# Patient Record
Sex: Female | Born: 1935 | Race: White | Hispanic: No | Smoking: Never smoker
Health system: Southern US, Community
[De-identification: ages and names within clinical notes are randomized; demographics above are authoritative.]

## PROBLEM LIST (undated history)

## (undated) DIAGNOSIS — M419 Scoliosis, unspecified: Secondary | ICD-10-CM

## (undated) DIAGNOSIS — G609 Hereditary and idiopathic neuropathy, unspecified: Secondary | ICD-10-CM

## (undated) DIAGNOSIS — E7800 Pure hypercholesterolemia, unspecified: Secondary | ICD-10-CM

## (undated) DIAGNOSIS — M810 Age-related osteoporosis without current pathological fracture: Secondary | ICD-10-CM

## (undated) DIAGNOSIS — D126 Benign neoplasm of colon, unspecified: Secondary | ICD-10-CM

## (undated) DIAGNOSIS — M159 Polyosteoarthritis, unspecified: Secondary | ICD-10-CM

## (undated) DIAGNOSIS — I10 Essential (primary) hypertension: Secondary | ICD-10-CM

## (undated) DIAGNOSIS — M1990 Unspecified osteoarthritis, unspecified site: Secondary | ICD-10-CM

## (undated) DIAGNOSIS — D509 Iron deficiency anemia, unspecified: Secondary | ICD-10-CM

## (undated) DIAGNOSIS — IMO0002 Reserved for concepts with insufficient information to code with codable children: Secondary | ICD-10-CM

## (undated) DIAGNOSIS — K219 Gastro-esophageal reflux disease without esophagitis: Secondary | ICD-10-CM

## (undated) DIAGNOSIS — I499 Cardiac arrhythmia, unspecified: Secondary | ICD-10-CM

## (undated) DIAGNOSIS — F411 Generalized anxiety disorder: Secondary | ICD-10-CM

## (undated) DIAGNOSIS — C8590 Non-Hodgkin lymphoma, unspecified, unspecified site: Secondary | ICD-10-CM

## (undated) DIAGNOSIS — M21372 Foot drop, left foot: Secondary | ICD-10-CM

## (undated) DIAGNOSIS — R430 Anosmia: Secondary | ICD-10-CM

## (undated) DIAGNOSIS — E039 Hypothyroidism, unspecified: Secondary | ICD-10-CM

## (undated) DIAGNOSIS — Z9049 Acquired absence of other specified parts of digestive tract: Secondary | ICD-10-CM

## (undated) DIAGNOSIS — E079 Disorder of thyroid, unspecified: Secondary | ICD-10-CM

## (undated) DIAGNOSIS — M21371 Foot drop, right foot: Secondary | ICD-10-CM

## (undated) DIAGNOSIS — K30 Functional dyspepsia: Secondary | ICD-10-CM

## (undated) DIAGNOSIS — D630 Anemia in neoplastic disease: Secondary | ICD-10-CM

## (undated) DIAGNOSIS — C801 Malignant (primary) neoplasm, unspecified: Secondary | ICD-10-CM

## (undated) DIAGNOSIS — G629 Polyneuropathy, unspecified: Secondary | ICD-10-CM

## (undated) DIAGNOSIS — M5137 Other intervertebral disc degeneration, lumbosacral region: Secondary | ICD-10-CM

## (undated) HISTORY — DX: Foot drop, right foot: M21.371

## (undated) HISTORY — DX: Anemia in neoplastic disease: D63.0

## (undated) HISTORY — DX: Non-Hodgkin lymphoma, unspecified, unspecified site: C85.90

## (undated) HISTORY — DX: Anosmia: R43.0

## (undated) HISTORY — DX: Acquired absence of other specified parts of digestive tract: Z90.49

## (undated) HISTORY — DX: Benign neoplasm of colon, unspecified: D12.6

## (undated) HISTORY — DX: Scoliosis, unspecified: M41.9

## (undated) HISTORY — DX: Reserved for concepts with insufficient information to code with codable children: IMO0002

## (undated) HISTORY — DX: Hypothyroidism, unspecified: E03.9

## (undated) HISTORY — DX: Pure hypercholesterolemia, unspecified: E78.00

## (undated) HISTORY — DX: Functional dyspepsia: K30

## (undated) HISTORY — DX: Generalized anxiety disorder: F41.1

## (undated) HISTORY — DX: Iron deficiency anemia, unspecified: D50.9

## (undated) HISTORY — DX: Hereditary and idiopathic neuropathy, unspecified: G60.9

## (undated) HISTORY — DX: Cardiac arrhythmia, unspecified: I49.9

## (undated) HISTORY — DX: Polyosteoarthritis, unspecified: M15.9

## (undated) HISTORY — DX: Foot drop, left foot: M21.372

## (undated) HISTORY — DX: Polyneuropathy, unspecified: G62.9

## (undated) HISTORY — DX: Other intervertebral disc degeneration, lumbosacral region: M51.37

## (undated) HISTORY — PX: ABDOMINAL HYSTERECTOMY: SHX81

## (undated) HISTORY — DX: Age-related osteoporosis without current pathological fracture: M81.0

---
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
## 1991-05-21 HISTORY — PX: PARTIAL COLECTOMY: SHX5273

## 1992-05-20 HISTORY — PX: HEMORRHOID SURGERY: SHX153

## 1998-05-20 HISTORY — PX: CATARACT EXTRACTION W/ INTRAOCULAR LENS IMPLANT: SHX1309

## 2011-11-28 DIAGNOSIS — G609 Hereditary and idiopathic neuropathy, unspecified: Secondary | ICD-10-CM

## 2011-11-28 HISTORY — DX: Hereditary and idiopathic neuropathy, unspecified: G60.9

## 2012-10-24 DIAGNOSIS — I499 Cardiac arrhythmia, unspecified: Secondary | ICD-10-CM

## 2012-10-24 DIAGNOSIS — I10 Essential (primary) hypertension: Secondary | ICD-10-CM | POA: Insufficient documentation

## 2012-10-24 DIAGNOSIS — M5137 Other intervertebral disc degeneration, lumbosacral region: Secondary | ICD-10-CM

## 2012-10-24 DIAGNOSIS — M51379 Other intervertebral disc degeneration, lumbosacral region without mention of lumbar back pain or lower extremity pain: Secondary | ICD-10-CM

## 2012-10-24 DIAGNOSIS — IMO0002 Reserved for concepts with insufficient information to code with codable children: Secondary | ICD-10-CM

## 2012-10-24 HISTORY — DX: Cardiac arrhythmia, unspecified: I49.9

## 2012-10-24 HISTORY — DX: Other intervertebral disc degeneration, lumbosacral region: M51.37

## 2012-10-24 HISTORY — DX: Other intervertebral disc degeneration, lumbosacral region without mention of lumbar back pain or lower extremity pain: M51.379

## 2012-10-24 HISTORY — DX: Reserved for concepts with insufficient information to code with codable children: IMO0002

## 2015-07-01 ENCOUNTER — Emergency Department (HOSPITAL_BASED_OUTPATIENT_CLINIC_OR_DEPARTMENT_OTHER)
Admission: EM | Admit: 2015-07-01 | Discharge: 2015-07-01 | Disposition: A | Discharge status: Home / Self Care | Payer: Medicare Other | Attending: Emergency Medicine | Admitting: Emergency Medicine

## 2015-07-01 ENCOUNTER — Encounter (HOSPITAL_BASED_OUTPATIENT_CLINIC_OR_DEPARTMENT_OTHER): Payer: Self-pay | Admitting: Emergency Medicine

## 2015-07-01 DIAGNOSIS — E7800 Pure hypercholesterolemia, unspecified: Secondary | ICD-10-CM | POA: Insufficient documentation

## 2015-07-01 DIAGNOSIS — Z7982 Long term (current) use of aspirin: Secondary | ICD-10-CM | POA: Diagnosis not present

## 2015-07-01 DIAGNOSIS — K219 Gastro-esophageal reflux disease without esophagitis: Secondary | ICD-10-CM | POA: Insufficient documentation

## 2015-07-01 DIAGNOSIS — M1990 Unspecified osteoarthritis, unspecified site: Secondary | ICD-10-CM | POA: Diagnosis not present

## 2015-07-01 DIAGNOSIS — Z79899 Other long term (current) drug therapy: Secondary | ICD-10-CM | POA: Diagnosis not present

## 2015-07-01 DIAGNOSIS — R350 Frequency of micturition: Secondary | ICD-10-CM | POA: Diagnosis present

## 2015-07-01 DIAGNOSIS — E079 Disorder of thyroid, unspecified: Secondary | ICD-10-CM | POA: Insufficient documentation

## 2015-07-01 DIAGNOSIS — Z859 Personal history of malignant neoplasm, unspecified: Secondary | ICD-10-CM | POA: Insufficient documentation

## 2015-07-01 DIAGNOSIS — N390 Urinary tract infection, site not specified: Secondary | ICD-10-CM

## 2015-07-01 HISTORY — DX: Disorder of thyroid, unspecified: E07.9

## 2015-07-01 HISTORY — DX: Pure hypercholesterolemia, unspecified: E78.00

## 2015-07-01 HISTORY — DX: Malignant (primary) neoplasm, unspecified: C80.1

## 2015-07-01 HISTORY — DX: Unspecified osteoarthritis, unspecified site: M19.90

## 2015-07-01 HISTORY — DX: Gastro-esophageal reflux disease without esophagitis: K21.9

## 2015-07-01 LAB — URINE MICROSCOPIC-ADD ON

## 2015-07-01 LAB — URINALYSIS, ROUTINE W REFLEX MICROSCOPIC
GLUCOSE, UA: NEGATIVE mg/dL
Ketones, ur: 15 mg/dL — AB
Nitrite: POSITIVE — AB
PH: 6 (ref 5.0–8.0)
PROTEIN: 100 mg/dL — AB
Specific Gravity, Urine: 1.02 (ref 1.005–1.030)

## 2015-07-01 MED ORDER — PHENAZOPYRIDINE HCL 100 MG PO TABS
200.0000 mg | ORAL_TABLET | Freq: Once | ORAL | Status: AC
  Administered 2015-07-01: 200 mg via ORAL
  Filled 2015-07-01: qty 2

## 2015-07-01 MED ORDER — CEPHALEXIN 250 MG PO CAPS
500.0000 mg | ORAL_CAPSULE | Freq: Once | ORAL | Status: AC
  Administered 2015-07-01: 500 mg via ORAL
  Filled 2015-07-01: qty 2

## 2015-07-01 MED ORDER — ALIGN PO CAPS: 1.0000 | ORAL_CAPSULE | Freq: Three times a day (TID) | ORAL | Status: DC

## 2015-07-01 MED ORDER — CEPHALEXIN 500 MG PO CAPS: 500.0000 mg | ORAL_CAPSULE | Freq: Four times a day (QID) | ORAL | Status: DC

## 2015-07-01 NOTE — ED Notes (Signed)
Patient has had urinary frequency and lower pelvic pain. Patient feels like she has a UTI

## 2015-07-01 NOTE — ED Provider Notes (Addendum)
CSN: [id number]     Arrival date & time 07/01/15  2050 History  By signing my name below, I, Judith Clarke, attest that this documentation has been prepared under the direction and in the presence of Hong Moring, MD. Electronically Signed: Helane Clarke, ED Scribe. 07/01/2015. 11:17 PM.      Chief Complaint  Patient presents with  . Urinary Frequency   Patient is a 82 y.o. female presenting with frequency. The history is provided by the patient. No language interpreter was used.  Urinary Frequency This is a new problem. The current episode started more than 2 days ago. The problem has been gradually worsening. Pertinent negatives include no abdominal pain. Nothing aggravates the symptoms. Nothing relieves the symptoms. She has tried nothing for the symptoms. The treatment provided no relief.   HPI Comments: Judith Clarke is a 82 y.o. female who presents to the Emergency Department complaining of urinary frequency onset 3 days ago. Pt feels as though she is having a UTI. She reports associated dysuria and lower pelvic pain. She has not seen her PCP for this. Pt denies fever, n/v/d, constipation, and abdominal pain.   Past Medical History  Diagnosis Date  . Cancer ([place])   . High cholesterol   . Thyroid disease   . GERD (gastroesophageal reflux disease)   . Arthritis    Past Surgical History  Procedure Laterality Date  . Abdominal hysterectomy    . Eye surgery    . Colon surgery     History reviewed. No pertinent family history. Social History  Substance Use Topics  . Smoking status: Never Smoker   . Smokeless tobacco: None  . Alcohol Use: No   OB History    No data available     Review of Systems  Constitutional: Negative for fever.  Gastrointestinal: Negative for nausea, vomiting, abdominal pain, diarrhea and constipation.  Genitourinary: Positive for dysuria, frequency and pelvic pain.  All other systems reviewed and are negative.   Allergies  Review of patient's  allergies indicates no known allergies.  Home Medications   Prior to Admission medications   Medication Sig Start Date End Date Taking? Authorizing Provider  Ascorbic Acid (VITAMIN C) 1000 MG tablet Take 1,000 mg by mouth daily.   Yes Historical Provider, MD  aspirin EC 81 MG tablet Take 81 mg by mouth daily.   Yes Historical Provider, MD  gabapentin (NEURONTIN) 600 MG tablet Take 600 mg by mouth 3 (three) times daily.   Yes Historical Provider, MD  levothyroxine (SYNTHROID, LEVOTHROID) 50 MCG tablet Take 50 mcg by mouth daily before breakfast.   Yes Historical Provider, MD  omeprazole (PRILOSEC) 20 MG capsule Take 20 mg by mouth daily.   Yes Historical Provider, MD  potassium chloride SA (K-DUR,KLOR-CON) 20 MEQ tablet Take 20 mEq by mouth 2 (two) times daily.   Yes Historical Provider, MD  simvastatin (ZOCOR) 20 MG tablet Take 20 mg by mouth daily.   Yes Historical Provider, MD  Zoledronic Acid (ZOMETA) 4 MG/100ML IVPB Inject 4 mg into the vein.   Yes Historical Provider, MD  bifidobacterium infantis (ALIGN) capsule Take 1 capsule by mouth 3 (three) times daily. 07/01/15   Meryle Pugmire, MD  cephALEXin (KEFLEX) 500 MG capsule Take 1 capsule (500 mg total) by mouth 4 (four) times daily. 07/01/15   Mussa Groesbeck, MD   BP 179/78 mmHg  Pulse 76  Temp(Src) 99.8 F (37.7 C) (Oral)  Resp 19  Ht 4\' 10"  (1.473 m)  Wt 150 lb (  68.04 kg)  BMI 31.36 kg/m2  SpO2 96% Physical Exam  Constitutional: She is oriented to person, place, and time. She appears well-developed and well-nourished.  HENT:  Head: Normocephalic and atraumatic.  Mouth/Throat: Oropharynx is clear and moist.  Eyes: Conjunctivae are normal. Pupils are equal, round, and reactive to light. Right eye exhibits no discharge. Left eye exhibits no discharge.  Neck: Neck supple.  Cardiovascular: Normal rate, regular rhythm and normal heart sounds.  Exam reveals no gallop and no friction rub.   No murmur heard. Pulmonary/Chest: Effort  normal and breath sounds normal. No respiratory distress.  Lungs are CTA  Abdominal: Soft. Bowel sounds are normal. She exhibits no mass. There is no tenderness. There is no rebound and no guarding.  Musculoskeletal: Normal range of motion.  Neurological: She is alert and oriented to person, place, and time. She has normal reflexes. Coordination normal.  Skin: Skin is warm and dry. No rash noted. She is not diaphoretic. No erythema.  Psychiatric: She has a normal mood and affect.  Nursing note and vitals reviewed.   ED Course  Procedures  DIAGNOSTIC STUDIES: Oxygen Saturation is 98% on RA, normal by my interpretation.    COORDINATION OF CARE: 11:06 PM - Discussed plans to order antibiotics and have pt f/u with her PCP. Pt advised of plan for treatment and pt agrees.  Labs Review Labs Reviewed  URINALYSIS, ROUTINE W REFLEX MICROSCOPIC (NOT AT [facility]) - Abnormal; Notable for the following:    Color, Urine ORANGE (*)    APPearance CLOUDY (*)    Hgb urine dipstick LARGE (*)    Bilirubin Urine SMALL (*)    Ketones, ur 15 (*)    Protein, ur 100 (*)    Nitrite POSITIVE (*)    Leukocytes, UA SMALL (*)    All other components within normal limits  URINE MICROSCOPIC-ADD ON - Abnormal; Notable for the following:    Squamous Epithelial / LPF 0-5 (*)    Bacteria, UA FEW (*)    All other components within normal limits    Imaging Review No results found. I have personally reviewed and evaluated these images and lab results as part of my medical decision-making.   EKG Interpretation None      MDM   Final diagnoses:  UTI (lower urinary tract infection)    Treated for UTI.  Follow up with your pmd for recheck this week.  Strict return precautions for fever, weakness, vomiting or any concerns  I personally performed the services described in this documentation, which was scribed in my presence. The recorded information has been reviewed and is accurate.     Veatrice Kells,  MD 07/02/15 0346  Tajia Szeliga, MD 07/02/15 [phone]

## 2015-07-14 ENCOUNTER — Encounter (HOSPITAL_BASED_OUTPATIENT_CLINIC_OR_DEPARTMENT_OTHER): Payer: Self-pay | Admitting: *Deleted

## 2015-07-14 ENCOUNTER — Emergency Department (HOSPITAL_BASED_OUTPATIENT_CLINIC_OR_DEPARTMENT_OTHER)
Admission: EM | Admit: 2015-07-14 | Discharge: 2015-07-14 | Disposition: A | Discharge status: Home / Self Care | Payer: Medicare Other | Attending: Emergency Medicine | Admitting: Emergency Medicine

## 2015-07-14 DIAGNOSIS — I10 Essential (primary) hypertension: Secondary | ICD-10-CM | POA: Diagnosis not present

## 2015-07-14 DIAGNOSIS — R1030 Lower abdominal pain, unspecified: Secondary | ICD-10-CM | POA: Diagnosis present

## 2015-07-14 DIAGNOSIS — K219 Gastro-esophageal reflux disease without esophagitis: Secondary | ICD-10-CM | POA: Diagnosis not present

## 2015-07-14 DIAGNOSIS — Z90710 Acquired absence of both cervix and uterus: Secondary | ICD-10-CM | POA: Diagnosis not present

## 2015-07-14 DIAGNOSIS — Z7982 Long term (current) use of aspirin: Secondary | ICD-10-CM | POA: Insufficient documentation

## 2015-07-14 DIAGNOSIS — Z79899 Other long term (current) drug therapy: Secondary | ICD-10-CM | POA: Insufficient documentation

## 2015-07-14 DIAGNOSIS — E079 Disorder of thyroid, unspecified: Secondary | ICD-10-CM | POA: Insufficient documentation

## 2015-07-14 DIAGNOSIS — M1990 Unspecified osteoarthritis, unspecified site: Secondary | ICD-10-CM | POA: Diagnosis not present

## 2015-07-14 DIAGNOSIS — N390 Urinary tract infection, site not specified: Secondary | ICD-10-CM | POA: Insufficient documentation

## 2015-07-14 DIAGNOSIS — E7800 Pure hypercholesterolemia, unspecified: Secondary | ICD-10-CM | POA: Insufficient documentation

## 2015-07-14 DIAGNOSIS — Z859 Personal history of malignant neoplasm, unspecified: Secondary | ICD-10-CM | POA: Insufficient documentation

## 2015-07-14 HISTORY — DX: Essential (primary) hypertension: I10

## 2015-07-14 LAB — URINALYSIS, ROUTINE W REFLEX MICROSCOPIC
Glucose, UA: NEGATIVE mg/dL
Ketones, ur: 15 mg/dL — AB
NITRITE: POSITIVE — AB
PROTEIN: 100 mg/dL — AB
SPECIFIC GRAVITY, URINE: 1.012 (ref 1.005–1.030)
pH: 7 (ref 5.0–8.0)

## 2015-07-14 LAB — URINE MICROSCOPIC-ADD ON

## 2015-07-14 MED ORDER — NITROFURANTOIN MONOHYD MACRO 100 MG PO CAPS: 100.0000 mg | ORAL_CAPSULE | Freq: Two times a day (BID) | ORAL | Status: DC

## 2015-07-14 MED ORDER — NITROFURANTOIN MONOHYD MACRO 100 MG PO CAPS
100.0000 mg | ORAL_CAPSULE | Freq: Once | ORAL | Status: AC
  Administered 2015-07-14: 100 mg via ORAL
  Filled 2015-07-14: qty 1

## 2015-07-14 NOTE — ED Provider Notes (Signed)
CSN: [id number]     Arrival date & time 07/14/15  1837 History  By signing my name below, I, Arianna Nassar, attest that this documentation has been prepared under the direction and in the presence of Merrily Pew, MD. Electronically Signed: Julien Nordmann, ED Scribe. 07/14/2015. 7:43 PM.    Chief Complaint  Patient presents with  . Abdominal Pain      The history is provided by the patient. No language interpreter was used.   HPI Comments: Judith Clarke is a 82 y.o. female who has a hx of high cholesterol, GERD, and HTN presents to the Emergency Department complaining of intermittent, gradual worsening lower abdominal pain with associated dysuria towards the end, onset one week ago. Daughter states pt was seen on 2/11 for an bladder infection and took the full course of antibiotics. She still has been having pain but notes that she has a hx bladder infections in the past. She denies fever, rash, constipation, diarrhea, nausea, vomiting, vaginal bleeding, hematuria, and hip pain.  Past Medical History  Diagnosis Date  . Cancer ([place])   . High cholesterol   . Thyroid disease   . GERD (gastroesophageal reflux disease)   . Arthritis   . Hypertension    Past Surgical History  Procedure Laterality Date  . Abdominal hysterectomy    . Eye surgery    . Colon surgery     No family history on file. Social History  Substance Use Topics  . Smoking status: Never Smoker   . Smokeless tobacco: Never Used  . Alcohol Use: No   OB History    No data available     Review of Systems  Constitutional: Negative for fever.  Gastrointestinal: Positive for abdominal pain. Negative for nausea, vomiting, diarrhea and constipation.  Genitourinary: Positive for dysuria. Negative for hematuria and vaginal bleeding.  Skin: Negative for rash.  All other systems reviewed and are negative.     Allergies  Review of patient's allergies indicates no known allergies.  Home Medications   Prior to  Admission medications   Medication Sig Start Date End Date Taking? Authorizing Provider  Ascorbic Acid (VITAMIN C) 1000 MG tablet Take 1,000 mg by mouth daily.   Yes Historical Provider, MD  aspirin EC 81 MG tablet Take 81 mg by mouth daily.   Yes Historical Provider, MD  bifidobacterium infantis (ALIGN) capsule Take 1 capsule by mouth 3 (three) times daily. 07/01/15  Yes April Palumbo, MD  gabapentin (NEURONTIN) 600 MG tablet Take 600 mg by mouth 3 (three) times daily.   Yes Historical Provider, MD  levothyroxine (SYNTHROID, LEVOTHROID) 50 MCG tablet Take 50 mcg by mouth daily before breakfast.   Yes Historical Provider, MD  omeprazole (PRILOSEC) 20 MG capsule Take 20 mg by mouth daily.   Yes Historical Provider, MD  potassium chloride SA (K-DUR,KLOR-CON) 20 MEQ tablet Take 20 mEq by mouth 2 (two) times daily.   Yes Historical Provider, MD  simvastatin (ZOCOR) 20 MG tablet Take 20 mg by mouth daily.   Yes Historical Provider, MD  Zoledronic Acid (ZOMETA) 4 MG/100ML IVPB Inject 4 mg into the vein.   Yes Historical Provider, MD  nitrofurantoin, macrocrystal-monohydrate, (MACROBID) 100 MG capsule Take 1 capsule (100 mg total) by mouth 2 (two) times daily. 07/14/15   Merrily Pew, MD   Triage vitals: BP 172/75 mmHg  Pulse 77  Temp(Src) 99.1 F (37.3 C) (Oral)  Resp 18  Ht 4\' 10"  (1.473 m)  Wt 150 lb (68.04 kg)  BMI  31.36 kg/m2  SpO2 94% Physical Exam  Constitutional: She is oriented to person, place, and time. She appears well-developed and well-nourished.  HENT:  Head: Normocephalic.  Eyes: EOM are normal.  Neck: Normal range of motion.  Pulmonary/Chest: Effort normal.  Abdominal: Soft. She exhibits no distension and no mass. There is no rebound and no guarding.  Suprapubic tenderness, no CVA tenderness  Musculoskeletal: Normal range of motion.  Neurological: She is alert and oriented to person, place, and time.  Psychiatric: She has a normal mood and affect.  Nursing note and vitals  reviewed.   ED Course  Procedures  DIAGNOSTIC STUDIES: Oxygen Saturation is 94% on RA, low by my interpretation.  COORDINATION OF CARE:  7:42 PM Discussed treatment plan with pt at bedside and pt agreed to plan.   Labs Review Labs Reviewed  URINALYSIS, ROUTINE W REFLEX MICROSCOPIC (NOT AT [facility]) - Abnormal; Notable for the following:    Color, Urine ORANGE (*)    Hgb urine dipstick TRACE (*)    Bilirubin Urine SMALL (*)    Ketones, ur 15 (*)    Protein, ur 100 (*)    Nitrite POSITIVE (*)    Leukocytes, UA SMALL (*)    All other components within normal limits  URINE MICROSCOPIC-ADD ON - Abnormal; Notable for the following:    Squamous Epithelial / LPF 0-5 (*)    Bacteria, UA MANY (*)    Casts GRANULAR CAST (*)    All other components within normal limits  URINE CULTURE    Imaging Review No results found. I have personally reviewed and evaluated these images and lab results as part of my medical decision-making.   EKG Interpretation None      MDM   Final diagnoses:  UTI (lower urinary tract infection)    82 yo F here with suprapubic abdominal pain and dysuria. Dx w/ uti a couple weeks ago, didn't improve, no culture done. Here with likely persistent UTI, started nitrofurantoin, as low risk for pyelo at this time. Culture sent ,will fu w/ pcp on Monday to ensure improvement.  I personally performed the services described in this documentation, which was scribed in my presence. The recorded information has been reviewed and is accurate.    Merrily Pew, MD 07/15/15 1247

## 2015-07-14 NOTE — ED Notes (Signed)
Lower abd pain. Recent treatment for UTI. Pt ambulatory to triage with her own walker

## 2015-07-14 NOTE — ED Notes (Signed)
Called x1 for triage, no answer.

## 2015-07-16 LAB — URINE CULTURE

## 2015-10-18 DIAGNOSIS — D509 Iron deficiency anemia, unspecified: Secondary | ICD-10-CM

## 2015-10-18 DIAGNOSIS — M21371 Foot drop, right foot: Secondary | ICD-10-CM

## 2015-10-18 DIAGNOSIS — E039 Hypothyroidism, unspecified: Secondary | ICD-10-CM

## 2015-10-18 DIAGNOSIS — Z9049 Acquired absence of other specified parts of digestive tract: Secondary | ICD-10-CM

## 2015-10-18 DIAGNOSIS — M21372 Foot drop, left foot: Secondary | ICD-10-CM | POA: Insufficient documentation

## 2015-10-18 HISTORY — DX: Acquired absence of other specified parts of digestive tract: Z90.49

## 2015-10-18 HISTORY — DX: Hypothyroidism, unspecified: E03.9

## 2015-10-18 HISTORY — DX: Iron deficiency anemia, unspecified: D50.9

## 2015-10-18 HISTORY — DX: Foot drop, right foot: M21.372

## 2015-10-18 HISTORY — DX: Foot drop, right foot: M21.371

## 2015-12-29 DIAGNOSIS — F411 Generalized anxiety disorder: Secondary | ICD-10-CM

## 2015-12-29 DIAGNOSIS — R430 Anosmia: Secondary | ICD-10-CM

## 2015-12-29 DIAGNOSIS — K30 Functional dyspepsia: Secondary | ICD-10-CM

## 2015-12-29 DIAGNOSIS — E7800 Pure hypercholesterolemia, unspecified: Secondary | ICD-10-CM

## 2015-12-29 DIAGNOSIS — C8590 Non-Hodgkin lymphoma, unspecified, unspecified site: Secondary | ICD-10-CM

## 2015-12-29 DIAGNOSIS — M159 Polyosteoarthritis, unspecified: Secondary | ICD-10-CM

## 2015-12-29 DIAGNOSIS — G629 Polyneuropathy, unspecified: Secondary | ICD-10-CM

## 2015-12-29 DIAGNOSIS — D126 Benign neoplasm of colon, unspecified: Secondary | ICD-10-CM

## 2015-12-29 DIAGNOSIS — M419 Scoliosis, unspecified: Secondary | ICD-10-CM | POA: Insufficient documentation

## 2015-12-29 DIAGNOSIS — D630 Anemia in neoplastic disease: Secondary | ICD-10-CM

## 2015-12-29 DIAGNOSIS — M8080XA Other osteoporosis with current pathological fracture, unspecified site, initial encounter for fracture: Secondary | ICD-10-CM | POA: Insufficient documentation

## 2015-12-29 DIAGNOSIS — M810 Age-related osteoporosis without current pathological fracture: Secondary | ICD-10-CM

## 2015-12-29 HISTORY — DX: Functional dyspepsia: K30

## 2015-12-29 HISTORY — DX: Age-related osteoporosis without current pathological fracture: M81.0

## 2015-12-29 HISTORY — DX: Anemia in neoplastic disease: D63.0

## 2015-12-29 HISTORY — DX: Polyosteoarthritis, unspecified: M15.9

## 2015-12-29 HISTORY — DX: Polyneuropathy, unspecified: G62.9

## 2015-12-29 HISTORY — DX: Pure hypercholesterolemia, unspecified: E78.00

## 2015-12-29 HISTORY — DX: Scoliosis, unspecified: M41.9

## 2015-12-29 HISTORY — DX: Benign neoplasm of colon, unspecified: D12.6

## 2015-12-29 HISTORY — DX: Generalized anxiety disorder: F41.1

## 2015-12-29 HISTORY — DX: Non-Hodgkin lymphoma, unspecified, unspecified site: C85.90

## 2015-12-29 HISTORY — DX: Anosmia: R43.0

## 2016-08-15 ENCOUNTER — Emergency Department (HOSPITAL_BASED_OUTPATIENT_CLINIC_OR_DEPARTMENT_OTHER): Payer: Medicare Other

## 2016-08-15 ENCOUNTER — Encounter (HOSPITAL_BASED_OUTPATIENT_CLINIC_OR_DEPARTMENT_OTHER): Payer: Self-pay | Admitting: *Deleted

## 2016-08-15 ENCOUNTER — Emergency Department (HOSPITAL_BASED_OUTPATIENT_CLINIC_OR_DEPARTMENT_OTHER)
Admission: EM | Admit: 2016-08-15 | Discharge: 2016-08-15 | Disposition: A | Discharge status: Home / Self Care | Payer: Medicare Other | Attending: Emergency Medicine | Admitting: Emergency Medicine

## 2016-08-15 DIAGNOSIS — R509 Fever, unspecified: Secondary | ICD-10-CM

## 2016-08-15 DIAGNOSIS — Z79899 Other long term (current) drug therapy: Secondary | ICD-10-CM | POA: Diagnosis not present

## 2016-08-15 DIAGNOSIS — R5383 Other fatigue: Secondary | ICD-10-CM | POA: Insufficient documentation

## 2016-08-15 DIAGNOSIS — I10 Essential (primary) hypertension: Secondary | ICD-10-CM | POA: Diagnosis not present

## 2016-08-15 DIAGNOSIS — Z7982 Long term (current) use of aspirin: Secondary | ICD-10-CM | POA: Insufficient documentation

## 2016-08-15 LAB — BASIC METABOLIC PANEL
Anion gap: 6 (ref 5–15)
BUN: 15 mg/dL (ref 6–20)
CALCIUM: 9.3 mg/dL (ref 8.9–10.3)
CO2: 23 mmol/L (ref 22–32)
Chloride: 109 mmol/L (ref 101–111)
Creatinine, Ser: 0.82 mg/dL (ref 0.44–1.00)
GFR calc Af Amer: >60 mL/min (ref >60)
GLUCOSE: 114 mg/dL — AB (ref 65–99)
POTASSIUM: 4 mmol/L (ref 3.5–5.1)
Sodium: 138 mmol/L (ref 135–145)

## 2016-08-15 LAB — CBC WITH DIFFERENTIAL/PLATELET
BASOS ABS: 0 10*3/uL (ref 0.0–0.1)
Basophils Relative: 0 %
EOS PCT: 1 %
Eosinophils Absolute: 0.2 10*3/uL (ref 0.0–0.7)
HEMATOCRIT: 41.7 % (ref 36.0–46.0)
Hemoglobin: 13.8 g/dL (ref 12.0–15.0)
LYMPHS PCT: 17 %
Lymphs Abs: 2.4 10*3/uL (ref 0.7–4.0)
MCH: 30.9 pg (ref 26.0–34.0)
MCHC: 33.1 g/dL (ref 30.0–36.0)
MCV: 93.5 fL (ref 78.0–100.0)
MONO ABS: 1.6 10*3/uL — AB (ref 0.1–1.0)
MONOS PCT: 11 %
NEUTROS ABS: 9.6 10*3/uL — AB (ref 1.7–7.7)
Neutrophils Relative %: 70 %
PLATELETS: 106 10*3/uL — AB (ref 150–400)
RBC: 4.46 MIL/uL (ref 3.87–5.11)
RDW: 13.1 % (ref 11.5–15.5)
WBC: 13.7 10*3/uL — ABNORMAL HIGH (ref 4.0–10.5)

## 2016-08-15 LAB — URINALYSIS, ROUTINE W REFLEX MICROSCOPIC
BILIRUBIN URINE: NEGATIVE
GLUCOSE, UA: NEGATIVE mg/dL
KETONES UR: NEGATIVE mg/dL
LEUKOCYTES UA: NEGATIVE
Nitrite: NEGATIVE
PH: 7 (ref 5.0–8.0)
PROTEIN: NEGATIVE mg/dL
Specific Gravity, Urine: 1.01 (ref 1.005–1.030)

## 2016-08-15 LAB — URINALYSIS, MICROSCOPIC (REFLEX): WBC, UA: NONE SEEN WBC/hpf (ref 0–5)

## 2016-08-15 MED ORDER — ACETAMINOPHEN 500 MG PO TABS
1000.0000 mg | ORAL_TABLET | Freq: Once | ORAL | Status: AC
  Administered 2016-08-15: 1000 mg via ORAL
  Filled 2016-08-15: qty 2

## 2016-08-15 NOTE — ED Notes (Signed)
Pt to the bathroom to get urine sample.   Pt has bruise from blood draw on left wrist.  Applied pressure dressing.

## 2016-08-15 NOTE — ED Notes (Signed)
Unable to obtain blood x 3 attempts (IV was successful, but no blood obtained)

## 2016-08-15 NOTE — Discharge Instructions (Signed)
Tylenol 1000 mg rotated with ibuprofen 600 mg every 4 hours as needed for fever.  Return to the emergency department if you develop specific symptoms such as productive cough, sore throat, abdominal pain, or other new and concerning symptoms.

## 2016-08-15 NOTE — ED Triage Notes (Signed)
Pt presents with generalized weakness and low grade fever x 1 day. Bladder pain. Similar Sx with UTI in the past

## 2016-08-15 NOTE — ED Provider Notes (Signed)
[place] DE[facility] Provider Note   CSN: [id number] Arrival date & time: 08/15/16  1801   By signing my name below, I, Judith Clarke, attest that this documentation has been prepared under the direction and in the presence of Judith Speak, MD. Electronically Signed: [place], ED Scribe. 08/15/16. 6:29 PM.  History   Chief Complaint Chief Complaint  Patient presents with  . Fatigue  . Fever    HPI Judith Clarke is a 83 y.o. female with a PMHx of HTN, thyroid dx, high cholesterol, CA, who presents to the Emergency Department complaining of generalized fatigue onset yesterday. Pt reports associated low-grade fever (TMAX 103) and bladder pain. Pt has not tried any medications for the relief of her symptoms. Daughter states that the pt has had similar symptoms when she had an UTI 1.5 years ago. Denies cough, nasal congestion, diarrhea, vomiting, flank pain, abdominal pain, and any other symptoms. Denies PMHx of DM or cardiac issues.   The history is provided by the patient and a relative. No language interpreter was used.    Past Medical History:  Diagnosis Date  . Arthritis   . Cancer ([place])   . GERD (gastroesophageal reflux disease)   . High cholesterol   . Hypertension   . Thyroid disease     There are no active problems to display for this patient.   Past Surgical History:  Procedure Laterality Date  . ABDOMINAL HYSTERECTOMY    . COLON SURGERY    . EYE SURGERY      OB History    No data available       Home Medications    Prior to Admission medications   Medication Sig Start Date End Date Taking? Authorizing Provider  Ascorbic Acid (VITAMIN C) 1000 MG tablet Take 1,000 mg by mouth daily.   Yes Historical Provider, MD  aspirin EC 81 MG tablet Take 81 mg by mouth daily.   Yes Historical Provider, MD  gabapentin (NEURONTIN) 600 MG tablet Take 600 mg by mouth 3 (three) times daily.   Yes Historical Provider, MD  levothyroxine (SYNTHROID, LEVOTHROID) 50 MCG  tablet Take 50 mcg by mouth daily before breakfast.   Yes Historical Provider, MD  metoprolol succinate (TOPROL-XL) 50 MG 24 hr tablet Take 25 mg by mouth daily. Take with or immediately following a meal.   Yes Historical Provider, MD  omeprazole (PRILOSEC) 20 MG capsule Take 20 mg by mouth daily.   Yes Historical Provider, MD  potassium chloride SA (K-DUR,KLOR-CON) 20 MEQ tablet Take 20 mEq by mouth 2 (two) times daily.   Yes Historical Provider, MD  simvastatin (ZOCOR) 20 MG tablet Take 20 mg by mouth daily.   Yes Historical Provider, MD  Zoledronic Acid (ZOMETA) 4 MG/100ML IVPB Inject 4 mg into the vein.   Yes Historical Provider, MD  bifidobacterium infantis (ALIGN) capsule Take 1 capsule by mouth 3 (three) times daily. 07/01/15   Judith Palumbo, MD  nitrofurantoin, macrocrystal-monohydrate, (MACROBID) 100 MG capsule Take 1 capsule (100 mg total) by mouth 2 (two) times daily. 07/14/15   Judith Pew, MD    Family History No family history on file.  Social History Social History  Substance Use Topics  . Smoking status: Never Smoker  . Smokeless tobacco: Never Used  . Alcohol use No     Allergies   Patient has no known allergies.   Review of Systems Review of Systems  All other systems reviewed and are negative.    Physical Exam Updated Vital  Signs BP (!) 166/79 (BP Location: Right Arm)   Pulse 80   Temp (!) 100.8 F (38.2 C) (Oral)   Resp 20   Ht 4\' 10"  (1.473 m)   Wt 150 lb (68 kg)   SpO2 96%   BMI 31.35 kg/m   Physical Exam  Constitutional: She is oriented to person, place, and time. She appears well-developed and well-nourished. No distress.  HENT:  Head: Normocephalic and atraumatic.  Right Ear: Hearing normal.  Left Ear: Hearing normal.  Nose: Nose normal.  Mouth/Throat: Oropharynx is clear and moist and mucous membranes are normal.  Eyes: EOM are normal.  Neck: Normal range of motion. Neck supple.  Cardiovascular: Normal rate, regular rhythm, S1 normal, S2  normal and normal heart sounds.  Exam reveals no gallop and no friction rub.   No murmur heard. Pulmonary/Chest: Effort normal and breath sounds normal. No respiratory distress. She exhibits no tenderness.  Abdominal: Soft. Normal appearance and bowel sounds are normal. There is no hepatosplenomegaly. There is no tenderness. There is no rebound, no guarding, no CVA tenderness, no tenderness at McBurney's point and negative Murphy's sign. No hernia.  Musculoskeletal: Normal range of motion.  Neurological: She is alert and oriented to person, place, and time. She has normal strength. No cranial nerve deficit or sensory deficit. Coordination normal. GCS eye subscore is 4. GCS verbal subscore is 5. GCS motor subscore is 6.  Skin: Skin is warm, dry and intact. No rash noted. No cyanosis.  Psychiatric: She has a normal mood and affect. Her speech is normal and behavior is normal. Thought content normal.  Nursing note and vitals reviewed.    ED Treatments / Results  DIAGNOSTIC STUDIES: Oxygen Saturation is 96% on RA, nl by my interpretation.    COORDINATION OF CARE: 6:28 PM Discussed treatment plan with pt at bedside which includes labs, UA, CXR and pt agreed to plan.   Labs (all labs ordered are listed, but only abnormal results are displayed) Labs Reviewed  BASIC METABOLIC PANEL - Abnormal; Notable for the following:       Result Value   Glucose, Bld 114 (*)    All other components within normal limits  CBC WITH DIFFERENTIAL/PLATELET - Abnormal; Notable for the following:    WBC 13.7 (*)    Platelets 106 (*)    Neutro Abs 9.6 (*)    Monocytes Absolute 1.6 (*)    All other components within normal limits  URINALYSIS, ROUTINE W REFLEX MICROSCOPIC - Abnormal; Notable for the following:    Hgb urine dipstick SMALL (*)    All other components within normal limits  URINALYSIS, MICROSCOPIC (REFLEX) - Abnormal; Notable for the following:    Bacteria, UA RARE (*)    Squamous Epithelial / LPF  0-5 (*)    All other components within normal limits    EKG  EKG Interpretation None       Radiology Dg Chest 2 View  Result Date: 08/15/2016 CLINICAL DATA:  Acute onset of generalized weakness and low-grade fever. Initial encounter. EXAM: CHEST  2 VIEW COMPARISON:  None. FINDINGS: The lungs are well-aerated. Mild vascular congestion is seen. Mild left basilar atelectasis is noted. There is no evidence of pleural effusion or pneumothorax. The heart is normal in size; the mediastinal contour is within normal limits. No acute osseous abnormalities are seen. Right convex thoracic scoliosis is noted. IMPRESSION: 1. Mild vascular congestion seen. Mild left basilar atelectasis noted. 2. Right convex thoracic scoliosis is seen. Electronically Signed  By: Garald Balding M.D.   On: 08/15/2016 19:27    Procedures Procedures (including critical care time)  Medications Ordered in ED Medications - No data to display   Initial Impression / Assessment and Plan / ED Course  I have reviewed the triage vital signs and the nursing notes.  Pertinent labs & imaging results that were available during my care of the patient were reviewed by me and considered in my medical decision making (see chart for details).  Patient presents with fever, not feeling well, but no specific complaints. The patient and daughter are concerned she may have a urinary tract infection, however she is not really having any urinary symptoms. She does have a slight white count, however chest x-ray and urinalysis are clear. She appears very stable and nontoxic. I suspect a viral infection.  I will recommend Tylenol rotated with ibuprofen. She is to return as needed for any problems.  Final Clinical Impressions(s) / ED Diagnoses   Final diagnoses:  None    New Prescriptions New Prescriptions   No medications on file   I personally performed the services described in this documentation, which was scribed in my presence.  The recorded information has been reviewed and is accurate.        Judith Speak, MD 08/15/16 2041

## 2016-12-15 ENCOUNTER — Emergency Department (HOSPITAL_BASED_OUTPATIENT_CLINIC_OR_DEPARTMENT_OTHER)
Admission: EM | Admit: 2016-12-15 | Discharge: 2016-12-15 | Disposition: A | Discharge status: Home / Self Care | Payer: Medicare Other | Attending: Emergency Medicine | Admitting: Emergency Medicine

## 2016-12-15 ENCOUNTER — Encounter (HOSPITAL_BASED_OUTPATIENT_CLINIC_OR_DEPARTMENT_OTHER): Payer: Self-pay | Admitting: Emergency Medicine

## 2016-12-15 ENCOUNTER — Emergency Department (HOSPITAL_BASED_OUTPATIENT_CLINIC_OR_DEPARTMENT_OTHER): Payer: Medicare Other

## 2016-12-15 DIAGNOSIS — M545 Low back pain, unspecified: Secondary | ICD-10-CM

## 2016-12-15 DIAGNOSIS — Z79899 Other long term (current) drug therapy: Secondary | ICD-10-CM | POA: Diagnosis not present

## 2016-12-15 DIAGNOSIS — Z859 Personal history of malignant neoplasm, unspecified: Secondary | ICD-10-CM | POA: Diagnosis not present

## 2016-12-15 DIAGNOSIS — E039 Hypothyroidism, unspecified: Secondary | ICD-10-CM | POA: Insufficient documentation

## 2016-12-15 DIAGNOSIS — I10 Essential (primary) hypertension: Secondary | ICD-10-CM | POA: Insufficient documentation

## 2016-12-15 MED ORDER — TRAMADOL HCL 50 MG PO TABS: 50.0000 mg | ORAL_TABLET | Freq: Four times a day (QID) | ORAL | 0 refills | Status: DC | PRN

## 2016-12-15 MED ORDER — TRAMADOL HCL 50 MG PO TABS
50.0000 mg | ORAL_TABLET | Freq: Once | ORAL | Status: AC
  Administered 2016-12-15: 50 mg via ORAL
  Filled 2016-12-15: qty 1

## 2016-12-15 NOTE — Discharge Instructions (Signed)
It was our pleasure to provide your ER care today - we hope that you feel better.  Rest. Avoid bending at waist or heavy lifting > 10 lbs.   Heat to sore area.  Take ibuprofen or aleve as need for pain.  You may also take ultram as need for pain - no driving when taking.  Follow up with back specialist/ortho in the next 1-2 weeks - call office Monday to arrange appointment.  Return to ER if worse, numbness/weakness, other concern.

## 2016-12-15 NOTE — ED Provider Notes (Signed)
[facility] DEPT [facility] Provider Note   CSN: [id number] Arrival date & time: 12/15/16  1157     History   Chief Complaint Chief Complaint  Patient presents with  . Back Pain    HPI Judith Clarke is a 83 y.o. female.  Patient c/o low back pain, esp right, for the past week. Hx ddd, and remote compression fx. No recent trauma or fall. Pain constant, dull, moderate. No radiation. No leg pain. No numbness/weakness. No problems w normal gi and gu function. Take ibuprofen for same without significant relief.  Also has hx multiple myeloma, states at baseline, no specific symptoms, no bone pain.    The history is provided by the patient.  Back Pain   Pertinent negatives include no chest pain, no fever, no numbness, no abdominal pain, no dysuria and no weakness.    Past Medical History:  Diagnosis Date  . Arthritis   . Cancer ([place])   . GERD (gastroesophageal reflux disease)   . High cholesterol   . Hypertension   . Thyroid disease     There are no active problems to display for this patient.   Past Surgical History:  Procedure Laterality Date  . ABDOMINAL HYSTERECTOMY    . COLON SURGERY    . EYE SURGERY      OB History    No data available       Home Medications    Prior to Admission medications   Medication Sig Start Date End Date Taking? Authorizing Provider  Ascorbic Acid (VITAMIN C) 1000 MG tablet Take 1,000 mg by mouth daily.    [provider]  aspirin EC 81 MG tablet Take 81 mg by mouth daily.    [provider]  bifidobacterium infantis (ALIGN) capsule Take 1 capsule by mouth 3 (three) times daily. 07/01/15   Palumbo, April, MD  gabapentin (NEURONTIN) 600 MG tablet Take 600 mg by mouth 3 (three) times daily.    [provider]  levothyroxine (SYNTHROID, LEVOTHROID) 50 MCG tablet Take 50 mcg by mouth daily before breakfast.    [provider]  metoprolol succinate (TOPROL-XL) 50 MG 24 hr tablet Take 25 mg by mouth daily.  Take with or immediately following a meal.    [provider]  nitrofurantoin, macrocrystal-monohydrate, (MACROBID) 100 MG capsule Take 1 capsule (100 mg total) by mouth 2 (two) times daily. 07/14/15   Mesner, Corene Cornea, MD  omeprazole (PRILOSEC) 20 MG capsule Take 20 mg by mouth daily.    [provider]  potassium chloride SA (K-DUR,KLOR-CON) 20 MEQ tablet Take 20 mEq by mouth 2 (two) times daily.    [provider]  simvastatin (ZOCOR) 20 MG tablet Take 20 mg by mouth daily.    [provider]  Zoledronic Acid (ZOMETA) 4 MG/100ML IVPB Inject 4 mg into the vein.    [provider]    Family History No family history on file.  Social History Social History  Substance Use Topics  . Smoking status: Never Smoker  . Smokeless tobacco: Never Used  . Alcohol use No     Allergies   Patient has no known allergies.   Review of Systems Review of Systems  Constitutional: Negative for chills and fever.  Respiratory: Negative for shortness of breath.   Cardiovascular: Negative for chest pain.  Gastrointestinal: Negative for abdominal pain and vomiting.  Genitourinary: Negative for dysuria and flank pain.  Musculoskeletal: Positive for back pain.  Skin: Negative for rash.  Neurological: Negative for weakness  and numbness.  Hematological: Does not bruise/bleed easily.     Physical Exam Updated Vital Signs BP (!) 183/77 (BP Location: Right Arm)   Pulse (!) 59   Temp 98.1 F (36.7 C) (Oral)   Resp 18   Wt 68 kg (150 lb)   SpO2 95%   BMI 31.35 kg/m   Physical Exam  Constitutional: She appears well-developed and well-nourished. No distress.  Eyes: Conjunctivae are normal. No scleral icterus.  Neck: Neck supple. No tracheal deviation present.  Cardiovascular: Normal rate, regular rhythm, normal heart sounds and intact distal pulses.  Exam reveals no gallop and no friction rub.   No murmur heard. Pulmonary/Chest: Effort normal and breath  sounds normal. No respiratory distress.  Abdominal: Soft. Normal appearance and bowel sounds are normal. She exhibits no distension. There is no tenderness.  No pulsatile mass.   Genitourinary:  Genitourinary Comments: No cva tenderess  Musculoskeletal: She exhibits no edema.  Lumbar tenderness, otherwise TLS non tender, aligned no step off.  Tenderness at right sciatic notch.   Neurological: She is alert.  Straight leg raise neg. Motor intact bil legs, stre 5/5. sens grossly intact. Steady gait.   Skin: Skin is warm and dry. No rash noted. She is not diaphoretic.  No rash/shingles to area of pain  Psychiatric: She has a normal mood and affect.  Nursing note and vitals reviewed.    ED Treatments / Results  Labs (all labs ordered are listed, but only abnormal results are displayed) Labs Reviewed - No data to display  EKG  EKG Interpretation None       Radiology Dg Lumbar Spine Complete  Result Date: 12/15/2016 CLINICAL DATA:  Low back pain for several weeks EXAM: LUMBAR SPINE - COMPLETE 4+ VIEW COMPARISON:  Metastatic bone survey 09/11/2015 FINDINGS: Prior leftward scoliosis of the lumbar spine. Diffuse degenerative disc and facet disease. Mild compression fracture through the superior endplate of L4, slightly progressed since prior study. No acute fracture. Degenerative changes in the hips bilaterally. Old healed fractures in the left pubic bone. IMPRESSION: Slight progression of the superior L4 endplate compression fracture. No acute bony abnormality. Degenerative changes and scoliosis. Electronically Signed   By: Rolm Baptise M.D.   On: 12/15/2016 10:55    Procedures Procedures (including critical care time)  Medications Ordered in ED Medications  traMADol (ULTRAM) tablet 50 mg (not administered)     Initial Impression / Assessment and Plan / ED Course  I have reviewed the triage vital signs and the nursing notes.  Pertinent labs & imaging results that were available  during my care of the patient were reviewed by me and considered in my medical decision making (see chart for details).  Ultram po.    Xrays.  Reviewed nursing notes and prior charts for additional history.   xrays w mild progression of L4 compression fx.   Will refer to ortho f/u.  Return precautions provided.     Final Clinical Impressions(s) / ED Diagnoses   Final diagnoses:  None    New Prescriptions New Prescriptions   No medications on file     Lajean Saver, MD 12/15/16 1100

## 2016-12-15 NOTE — ED Triage Notes (Signed)
R side back pain radiating into hip x 1 week, denies injury. Seen by UC Friday and they gave her a cortisone shot. Pt states pain is ongoing. Denies urinary symptoms.

## 2016-12-15 NOTE — ED Notes (Signed)
Patient transported to X-ray 

## 2016-12-30 ENCOUNTER — Non-Acute Institutional Stay (SKILLED_NURSING_FACILITY): Payer: Medicare Other | Admitting: Internal Medicine

## 2016-12-30 ENCOUNTER — Encounter: Payer: Self-pay | Admitting: Internal Medicine

## 2016-12-30 DIAGNOSIS — M8008XD Age-related osteoporosis with current pathological fracture, vertebra(e), subsequent encounter for fracture with routine healing: Secondary | ICD-10-CM | POA: Diagnosis not present

## 2016-12-30 DIAGNOSIS — K219 Gastro-esophageal reflux disease without esophagitis: Secondary | ICD-10-CM | POA: Diagnosis not present

## 2016-12-30 DIAGNOSIS — S32010A Wedge compression fracture of first lumbar vertebra, initial encounter for closed fracture: Secondary | ICD-10-CM | POA: Insufficient documentation

## 2016-12-30 DIAGNOSIS — D508 Other iron deficiency anemias: Secondary | ICD-10-CM

## 2016-12-30 DIAGNOSIS — S32010D Wedge compression fracture of first lumbar vertebra, subsequent encounter for fracture with routine healing: Secondary | ICD-10-CM

## 2016-12-30 DIAGNOSIS — D630 Anemia in neoplastic disease: Secondary | ICD-10-CM

## 2016-12-30 DIAGNOSIS — C9000 Multiple myeloma not having achieved remission: Secondary | ICD-10-CM | POA: Diagnosis not present

## 2016-12-30 DIAGNOSIS — E039 Hypothyroidism, unspecified: Secondary | ICD-10-CM | POA: Diagnosis not present

## 2016-12-30 DIAGNOSIS — E7800 Pure hypercholesterolemia, unspecified: Secondary | ICD-10-CM

## 2016-12-30 DIAGNOSIS — G629 Polyneuropathy, unspecified: Secondary | ICD-10-CM

## 2016-12-30 DIAGNOSIS — I10 Essential (primary) hypertension: Secondary | ICD-10-CM | POA: Diagnosis not present

## 2016-12-30 NOTE — Progress Notes (Signed)
: Provider:   Location:  [facility] Room Number: 503 Place of Service:  SNF [phone])  Provider: Noah Delaine. Sheppard Coil, MD  PCP: Wallene Dales, MD Patient Care Team: Wallene Dales, MD as PCP - General [facility] Medicine)  Extended Emergency Contact Information Primary Emergency Contact: [place] Phone: [phone] Relation: Daughter     Allergies: Duloxetine and Pregabalin  Chief Complaint  Patient presents with  . New Admit To SNF    following hospitalization 12/22/16 to  12/27/16 closed compression fracture of lumbar vertebra     HPI: Patient is 83 y.o. female with hypertension, GERD, hypothyroidism, polyneuropathy, and multiple myeloma who presented to [facility] ED from home with complaints of severe right sided low back pain. The patient is chronic, however it has worsened over the past week and even more so today. She has been seen by orthopedics which is told her that if her pain is no longer controlled by oxycodone she should go to the emergency department. Patient denied nausea vomiting diarrhea dysuria hematuria difficulty urinating, bowel or bladder incontinence, numbness, or tingling. Patient denies any fall. Patient was admitted to [facility] from 8/5-10 where an MRI showed an acute fracture of L1. And underwent a vertebral augmentation and tolerated procedure well. Patient still continues on pain medicines status post her procedure. Patient is admitted to skilled nursing facility for OT PT. While at skilled nursing facility patient will be followed for hypertension treated with Toprol-XL 12.5 mg daily, osteoporosis treated with zoledronic acid every 3 months along with calcium plus vitamin D daily, and hypothyroidism treated with Synthroid.  Past Medical History:  Diagnosis Date  . Acquired hypothyroidism 10/18/2015  . Age related osteoporosis 12/29/2015  . Anemia in neoplastic  disease 12/29/2015  . Anosmia 12/29/2015  . Arthritis   . Benign neoplasm of colon 12/29/2015  . Bilateral foot-drop 10/18/2015  . Cancer ([place])   . Cardiac dysrhythmia 10/24/2012  . DDD (degenerative disc disease), lumbosacral 10/24/2012  . Functional dyspepsia 12/29/2015  . Generalized anxiety disorder 12/29/2015  . GERD (gastroesophageal reflux disease)   . High cholesterol   . Hypercholesterolemia 12/29/2015  . Hypertension   . Iron deficiency anemia 10/18/2015  . Malignant lymphoma ([place]) 12/29/2015  . Neuropathy, peripheral, idiopathic 11/28/2011  . Osteoarthritis, generalized 12/29/2015  . Polyneuropathy 12/29/2015  . S/P colectomy 10/18/2015  . Scoliosis 12/29/2015  . Thoracic or lumbosacral neuritis or radiculitis 10/24/2012  . Thyroid disease     Past Surgical History:  Procedure Laterality Date  . ABDOMINAL HYSTERECTOMY    . CATARACT EXTRACTION W/ INTRAOCULAR LENS IMPLANT  2000  . [place]  . [place]  . MASTOIDECTOMY  1933  . PARTIAL COLECTOMY  1993    Allergies as of 12/30/2016      Reactions   Duloxetine    Other reaction(s): NAUSEA   Pregabalin    Other reaction(s): CONFUSION,VISUAL DISTURBANCE      Medication List       Accurate as of 12/30/16 11:25 AM. Always use your most recent med list.          aspirin EC 81 MG tablet Take 81 mg by mouth daily.   calcium-vitamin D 500-200 MG-UNIT tablet Take 1 tablet by mouth. Take one tablet daily with lunch   ferrous sulfate 325 (65 FE) MG tablet Take 325 mg by mouth. Take one tablet on Mon, Wed, Fri, Sat.  gabapentin 600 MG tablet Commonly known as:  NEURONTIN Take 600 mg by mouth 3 (three) times daily.   HYDROcodone-acetaminophen 5-325 MG tablet Commonly known as:  NORCO/VICODIN Take 1 tablet by mouth. Take one tablet every 6 hours as needed for up to 5 days   ibuprofen 400 MG tablet Commonly known as:  ADVIL,MOTRIN Take 400 mg by mouth. Take one tablet every 8 hours as needed for  pain   levothyroxine 50 MCG tablet Commonly known as:  SYNTHROID, LEVOTHROID Take 50 mcg by mouth daily before breakfast.   metoprolol succinate 25 MG 24 hr tablet Commonly known as:  TOPROL-XL Take 25 mg by mouth. Take 12.5 mg once daily   MULTI-VITAMINS Tabs Take by mouth. Take one tablet daily   omeprazole 20 MG capsule Commonly known as:  PRILOSEC Take 20 mg by mouth daily.   potassium chloride SA 20 MEQ tablet Commonly known as:  K-DUR,KLOR-CON Take 20 mEq by mouth. Take one tablet once daily   PROBIOTIC ACIDOPHILUS PO Take by mouth. Take one tablet every evening   simvastatin 10 MG tablet Commonly known as:  ZOCOR Take 10 mg by mouth. Take one tablet nightly for cholesterol   TH VITAMIN B12 100 MCG tablet Generic drug:  cyanocobalamin Take by mouth. Take one tablet daily for Vitamin B-12   vitamin C with rose hips 1000 MG tablet Take 500 mg by mouth daily.       Meds ordered this encounter  Medications  . ferrous sulfate 325 (65 FE) MG tablet    Sig: Take 325 mg by mouth. Take one tablet on Mon, Wed, Fri, Sat.  . metoprolol succinate (TOPROL-XL) 25 MG 24 hr tablet    Sig: Take 25 mg by mouth. Take 12.5 mg once daily  . simvastatin (ZOCOR) 10 MG tablet    Sig: Take 10 mg by mouth. Take one tablet nightly for cholesterol  . Calcium Carb-Cholecalciferol (CALCIUM-VITAMIN D) 500-200 MG-UNIT tablet    Sig: Take 1 tablet by mouth. Take one tablet daily with lunch  . HYDROcodone-acetaminophen (NORCO/VICODIN) 5-325 MG tablet    Sig: Take 1 tablet by mouth. Take one tablet every 6 hours as needed for up to 5 days  . ibuprofen (ADVIL,MOTRIN) 400 MG tablet    Sig: Take 400 mg by mouth. Take one tablet every 8 hours as needed for pain  . Multiple Vitamin (MULTI-VITAMINS) TABS    Sig: Take by mouth. Take one tablet daily  . Lactobacillus (PROBIOTIC ACIDOPHILUS PO)    Sig: Take by mouth. Take one tablet every evening  . cyanocobalamin (TH VITAMIN B12) 100 MCG tablet     Sig: Take by mouth. Take one tablet daily for Vitamin B-12  . Ascorbic Acid (VITAMIN C WITH ROSE HIPS) 1000 MG tablet    Sig: Take 500 mg by mouth daily.  Marland Kitchen DISCONTD: levothyroxine (SYNTHROID, LEVOTHROID) 50 MCG tablet    Sig: Take one tablet before breakfast    Immunization History  Administered Date(s) Administered  . Influenza-Unspecified 01/23/2016  . Pneumococcal Conjugate-13 01/01/2016    Social History  Substance Use Topics  . Smoking status: Never Smoker  . Smokeless tobacco: Never Used  . Alcohol use No    Family history is   Family History  Problem Relation Age of Onset  . Cancer Mother   . Cancer Father   . Heart disease Brother   . Stroke Brother       Review of Systems  DATA OBTAINED: from patient GENERAL:  no fevers, fatigue, appetite changes SKIN: No itching, or rash EYES: No eye pain, redness, discharge EARS: No earache, tinnitus, change in hearing NOSE: No congestion, drainage or bleeding  MOUTH/THROAT: No mouth or tooth pain, No sore throat RESPIRATORY: No cough, wheezing, SOB CARDIAC: No chest pain, palpitations, lower extremity edema  GI: No abdominal pain, No N/V/D or constipation, No heartburn or reflux  GU: No dysuria, frequency or urgency, or incontinence  MUSCULOSKELETAL: + unrelieved bone/joint pain NEUROLOGIC: No headache, dizziness or focal weakness PSYCHIATRIC: No c/o anxiety or sadness   Vitals:   12/30/16 1042  BP: 128/79  Pulse: 69  Resp: 18  Temp: (!) 97.3 F (36.3 C)  SpO2: 95%    SpO2 Readings from Last 1 Encounters:  12/30/16 95%   Body mass index is 25.42 kg/m.     Physical Exam  GENERAL APPEARANCE: Very pleasant black female, Alert, conversant,  No acute distress.  SKIN: No diaphoresis rash HEAD: Normocephalic, atraumatic  EYES: Conjunctiva/lids clear. Pupils round, reactive. EOMs intact.  EARS: External exam WNL, canals clear. Hearing grossly normal.  NOSE: No deformity or discharge.  MOUTH/THROAT: Lips  w/o lesions  RESPIRATORY: Breathing is even, unlabored. Lung sounds are clear   CARDIOVASCULAR: Heart RRR no murmurs, rubs or gallops. No peripheral edema.   GASTROINTESTINAL: Abdomen is soft, non-tender, not distended w/ normal bowel sounds. GENITOURINARY: Bladder non tender, not distended  MUSCULOSKELETAL: Wearing brace NEUROLOGIC:  Cranial nerves 2-12 grossly intact. Moves all extremities  PSYCHIATRIC: Mood and affect appropriate to situation, no behavioral issues  Patient Active Problem List   Diagnosis Date Noted  . Age related osteoporosis 12/29/2015  . Anemia in neoplastic disease 12/29/2015  . Anosmia 12/29/2015  . Benign neoplasm of colon 12/29/2015  . Functional dyspepsia 12/29/2015  . Generalized anxiety disorder 12/29/2015  . Hypercholesterolemia 12/29/2015  . Malignant lymphoma ([place]) 12/29/2015  . Osteoarthritis, generalized 12/29/2015  . Polyneuropathy 12/29/2015  . Scoliosis 12/29/2015  . Acquired hypothyroidism 10/18/2015  . Bilateral foot-drop 10/18/2015  . Iron deficiency anemia 10/18/2015  . S/P colectomy 10/18/2015  . Cardiac dysrhythmia 10/24/2012  . DDD (degenerative disc disease), lumbosacral 10/24/2012  . Essential hypertension 10/24/2012  . Thoracic or lumbosacral neuritis or radiculitis 10/24/2012  . Neuropathy, peripheral, idiopathic 11/28/2011      Labs reviewed: Basic Metabolic Panel:    Component Value Date/Time   NA 138 08/15/2016 1935   K 4.0 08/15/2016 1935   CL 109 08/15/2016 1935   CO2 23 08/15/2016 1935   GLUCOSE 114 (H) 08/15/2016 1935   BUN 15 08/15/2016 1935   CREATININE 0.82 08/15/2016 1935   CALCIUM 9.3 08/15/2016 1935   GFRNONAA >60 08/15/2016 1935   GFRAA >60 08/15/2016 1935     Recent Labs  08/15/16 1935  NA 138  K 4.0  CL 109  CO2 23  GLUCOSE 114*  BUN 15  CREATININE 0.82  CALCIUM 9.3   Liver Function Tests: No results for input(s): AST, ALT, ALKPHOS, BILITOT, PROT, ALBUMIN in the last 8760 hours. No  results for input(s): LIPASE, AMYLASE in the last 8760 hours. No results for input(s): AMMONIA in the last 8760 hours. CBC:  Recent Labs  08/15/16 1935  WBC 13.7*  NEUTROABS 9.6*  HGB 13.8  HCT 41.7  MCV 93.5  PLT 106*   Lipid No results for input(s): CHOL, HDL, LDLCALC, TRIG in the last 8760 hours.  Cardiac Enzymes: No results for input(s): CKTOTAL, CKMB, CKMBINDEX, TROPONINI in the last 8760 hours. BNP: No results for input(s):  BNP in the last 8760 hours. No results found for: MICROALBUR No results found for: HGBA1C No results found for: TSH No results found for: VITAMINB12 No results found for: FOLATE No results found for: IRON, TIBC, FERRITIN  Imaging and Procedures obtained prior to SNF admission: Dg Lumbar Spine Complete  Result Date: 12/15/2016 CLINICAL DATA:  Low back pain for several weeks EXAM: LUMBAR SPINE - COMPLETE 4+ VIEW COMPARISON:  Metastatic bone survey 09/11/2015 FINDINGS: Prior leftward scoliosis of the lumbar spine. Diffuse degenerative disc and facet disease. Mild compression fracture through the superior endplate of L4, slightly progressed since prior study. No acute fracture. Degenerative changes in the hips bilaterally. Old healed fractures in the left pubic bone. IMPRESSION: Slight progression of the superior L4 endplate compression fracture. No acute bony abnormality. Degenerative changes and scoliosis. Electronically Signed   By: Rolm Baptise M.D.   On: 12/15/2016 10:55     Not all labs, radiology exams or other studies done during hospitalization come through on my EPIC note; however they are reviewed by me.    Assessment and Plan  ACUTE COMPRESSION FRACTURE OF L1/OSTEOPOROSIS with fracture-patient was already on pain medications and was still symptomatic; patient was taken to IR where she underwent a vertebral augmentation with good result; patient is treated for osteoporosis include Zoledronic acid IV every 3 minutes and calcium plus vitamin  D SNF - admitted for OT/PT; will continue vitamin D2 100 mg plus calcium 500 mg 1 by mouth daily and zoledronic acid 4 mg IV every 3 months; patient's pain does not seem to be well controlled her pain medicine is when necessary; will start Norco 5/325 one by mouth at 9 AM 2 PM and 9 PM on a regular basis  HISTORY MULTIPLE MYELOMA-patient is being followed up by Dr. Harlow Asa oncology in [facility] SNF - continue outpatient follow-up through Dr. Harlow Asa   HYPERTENSION SNF - controlled to continue Toprol-XL 12.5 mg daily  POLYNEUROPATHY SNF -controlled continue Neurontin 600 mg by mouth 3 times a day  HYPOTHYROIDISM SNF - stable continue Synthroid 50 g by mouth daily  GERD SNF - not stated as uncontrolled; continue Prilosec 20 mg by mouth daily  HYPERLIPIDEMIA SNF - not stated as uncontrolled continue Zocor 10 mg by mouth daily   Time spent greater than 45 minutes;> 50% of time with patient was spent reviewing records, labs, tests and studies, counseling and developing plan of care  Webb Silversmith D. Sheppard Coil, MD

## 2016-12-31 ENCOUNTER — Other Ambulatory Visit (HOSPITAL_COMMUNITY): Payer: Self-pay | Admitting: Diagnostic Radiology

## 2016-12-31 DIAGNOSIS — S32010A Wedge compression fracture of first lumbar vertebra, initial encounter for closed fracture: Secondary | ICD-10-CM

## 2017-01-23 ENCOUNTER — Other Ambulatory Visit: Payer: Medicare Other

## 2017-02-04 ENCOUNTER — Non-Acute Institutional Stay (SKILLED_NURSING_FACILITY): Payer: Medicare Other | Admitting: Internal Medicine

## 2017-02-04 ENCOUNTER — Encounter: Payer: Self-pay | Admitting: Internal Medicine

## 2017-02-04 DIAGNOSIS — M8008XD Age-related osteoporosis with current pathological fracture, vertebra(e), subsequent encounter for fracture with routine healing: Secondary | ICD-10-CM

## 2017-02-04 DIAGNOSIS — G629 Polyneuropathy, unspecified: Secondary | ICD-10-CM | POA: Diagnosis not present

## 2017-02-04 DIAGNOSIS — E7800 Pure hypercholesterolemia, unspecified: Secondary | ICD-10-CM

## 2017-02-04 DIAGNOSIS — E039 Hypothyroidism, unspecified: Secondary | ICD-10-CM | POA: Diagnosis not present

## 2017-02-04 DIAGNOSIS — S32010D Wedge compression fracture of first lumbar vertebra, subsequent encounter for fracture with routine healing: Secondary | ICD-10-CM | POA: Diagnosis not present

## 2017-02-04 DIAGNOSIS — I10 Essential (primary) hypertension: Secondary | ICD-10-CM

## 2017-02-04 DIAGNOSIS — K219 Gastro-esophageal reflux disease without esophagitis: Secondary | ICD-10-CM | POA: Diagnosis not present

## 2017-02-04 DIAGNOSIS — C9000 Multiple myeloma not having achieved remission: Secondary | ICD-10-CM | POA: Diagnosis not present

## 2017-02-04 NOTE — Progress Notes (Signed)
Location:  [facility] Room Number: [place]:  SNF [phone])  Provider: Noah Delaine. Sheppard Coil, MD  PCP: Hennie Duos, MD Patient Care Team: Hennie Duos, MD as PCP - General (Internal Medicine)  Extended Emergency Contact Information Primary Emergency Contact: [place] Phone: [phone] Relation: Daughter  Allergies  Allergen Reactions  . Duloxetine     Other reaction(s): NAUSEA  . Pregabalin     Other reaction(s): CONFUSION,VISUAL DISTURBANCE    Chief Complaint  Patient presents with  . Discharge Note    discharge from SNF to home    HPI:  83 y.o. female with hypertension, GERD, hypothyroidism,  polyneuropathy and multiple myeloma who was admitted to [facility] from 8/5-10 where an MRI showed an acute fracture of L1. Patient underwent a vertebral augmentation and tolerated procedure well. Patient was admitted to skilled nursing facility for OT/PT and is now ready to be discharged to home.    Past Medical History:  Diagnosis Date  . Acquired hypothyroidism 10/18/2015  . Age related osteoporosis 12/29/2015  . Anemia in neoplastic disease 12/29/2015  . Anosmia 12/29/2015  . Arthritis   . Benign neoplasm of colon 12/29/2015  . Bilateral foot-drop 10/18/2015  . Cancer ([place])   . Cardiac dysrhythmia 10/24/2012  . DDD (degenerative disc disease), lumbosacral 10/24/2012  . Functional dyspepsia 12/29/2015  . Generalized anxiety disorder 12/29/2015  . GERD (gastroesophageal reflux disease)   . High cholesterol   . Hypercholesterolemia 12/29/2015  . Hypertension   . Iron deficiency anemia 10/18/2015  . Malignant lymphoma ([place]) 12/29/2015  . Neuropathy, peripheral, idiopathic 11/28/2011  . Osteoarthritis, generalized 12/29/2015  . Polyneuropathy 12/29/2015  . S/P colectomy 10/18/2015  . Scoliosis 12/29/2015  . Thoracic or lumbosacral neuritis or radiculitis 10/24/2012  . Thyroid disease      Past Surgical History:  Procedure Laterality Date  . ABDOMINAL HYSTERECTOMY    . CATARACT EXTRACTION W/ INTRAOCULAR LENS IMPLANT  2000  . [place]  . [place]  . MASTOIDECTOMY  1933  . PARTIAL COLECTOMY  1993     reports that she has never smoked. She has never used smokeless tobacco. She reports that she does not drink alcohol or use drugs. Social History   Social History  . Marital status: Widowed    Spouse name: N/A  . Number of children: N/A  . Years of education: N/A   Occupational History  . retired Surveyor, [employer]    Social History Main Topics  . Smoking status: Never Smoker  . Smokeless tobacco: Never Used  . Alcohol use No  . Drug use: No  . Sexual activity: No   Other Topics Concern  . Not on file   Social History Narrative   Admitted to [facility] 12/27/16   Widowed   Never smoked   Alcohol none    Full Code    Pertinent  Health Maintenance Due  Topic Date Due  . DEXA SCAN  07/14/1994  . INFLUENZA VACCINE  12/18/2016  . PNA vac Low Risk Adult (2 of 2 - PPSV23) 12/31/2016    Medications: Allergies as of 02/04/2017      Reactions   Duloxetine    Other reaction(s): NAUSEA   Pregabalin    Other reaction(s): CONFUSION,VISUAL DISTURBANCE      Medication List       Accurate as of 02/04/17 11:59 PM. Always use your  most recent med list.          aspirin EC 81 MG tablet Take 81 mg by mouth daily.   calcium-vitamin D 500-200 MG-UNIT tablet Take 1 tablet by mouth. Take one tablet daily with lunch   ferrous sulfate 325 (65 FE) MG tablet Take 325 mg by mouth. Take one tablet on Mon, Wed, Fri, Sat.   gabapentin 600 MG tablet Commonly known as:  NEURONTIN Take 600 mg by mouth. Take one tablet four times daily   HYDROcodone-acetaminophen 5-325 MG tablet Commonly known as:  NORCO/VICODIN Take 1 tablet by mouth. Take one tablet every 6 hours as needed for up to 5 days . Take one tablet every 8 hours  for pain routinely   ibuprofen 400 MG tablet Commonly known as:  ADVIL,MOTRIN Take 400 mg by mouth. Take one tablet every 8 hours as needed for pain   levothyroxine 50 MCG tablet Commonly known as:  SYNTHROID, LEVOTHROID Take 50 mcg by mouth daily before breakfast.   methocarbamol 500 MG tablet Commonly known as:  ROBAXIN Take 500 mg by mouth. Take one tablet twice daily for muscle spasms   metoprolol succinate 25 MG 24 hr tablet Commonly known as:  TOPROL-XL Take 12.5 mg by mouth. Take  once daily   MULTI-VITAMINS Tabs Take by mouth. Take one tablet daily   omeprazole 20 MG capsule Commonly known as:  PRILOSEC Take 20 mg by mouth daily.   potassium chloride SA 20 MEQ tablet Commonly known as:  K-DUR,KLOR-CON Take 20 mEq by mouth. Take one tablet once daily   saccharomyces boulardii 250 MG capsule Commonly known as:  FLORASTOR Take 250 mg by mouth. Take one capsule every evening   simvastatin 10 MG tablet Commonly known as:  ZOCOR Take 10 mg by mouth. Take one tablet nightly for cholesterol   TH VITAMIN B12 100 MCG tablet Generic drug:  cyanocobalamin Take by mouth. Take one tablet daily for Vitamin B-12   vitamin C with rose hips 1000 MG tablet Take 500 mg by mouth daily.        Vitals:   02/04/17 1243  BP: (!) 152/74  Pulse: 75  Resp: 20  Temp: 98.3 F (36.8 C)  SpO2: 94%  Weight: 137 lb (62.1 kg)  Height: '5\' 2"'  (1.575 m)   Body mass index is 25.06 kg/m.  Physical Exam  GENERAL APPEARANCE: Alert, conversant. No acute distress.  HEENT: Unremarkable. RESPIRATORY: Breathing is even, unlabored. Lung sounds are clear   CARDIOVASCULAR: Heart RRR no murmurs, rubs or gallops. No peripheral edema.  GASTROINTESTINAL: Abdomen is soft, non-tender, not distended w/ normal bowel sounds.  NEUROLOGIC: Cranial nerves 2-12 grossly intact. Moves all extremities   Labs reviewed: Basic Metabolic Panel:  Recent Labs  08/15/16 1935  NA 138  K 4.0  CL 109   CO2 23  GLUCOSE 114*  BUN 15  CREATININE 0.82  CALCIUM 9.3   No results found for: [facility] Liver Function Tests: No results for input(s): AST, ALT, ALKPHOS, BILITOT, PROT, ALBUMIN in the last 8760 hours. No results for input(s): LIPASE, AMYLASE in the last 8760 hours. No results for input(s): AMMONIA in the last 8760 hours. CBC:  Recent Labs  08/15/16 1935  WBC 13.7*  NEUTROABS 9.6*  HGB 13.8  HCT 41.7  MCV 93.5  PLT 106*   Lipid No results for input(s): CHOL, HDL, LDLCALC, TRIG in the last 8760 hours. Cardiac Enzymes: No results for input(s): CKTOTAL, CKMB, CKMBINDEX, TROPONINI in the last 8760  hours. BNP: No results for input(s): BNP in the last 8760 hours. CBG: No results for input(s): GLUCAP in the last 8760 hours.  Procedures and Imaging Studies During Stay: No results found.  Assessment/Plan:   Pathological fracture of vertebra due to age-related osteoporosis with routine healing, subsequent encounter  Closed compression fracture of L1 lumbar vertebra with routine healing, subsequent encounter  Polyneuropathy  Essential hypertension  Multiple myeloma not having achieved remission ([place])  Hypercholesterolemia  Gastroesophageal reflux disease without esophagitis  Acquired hypothyroidism   Patient is being discharged with the following home health services:  OT/PT/Nursing  Patient is being discharged with the following durable medical equipment:  none  Patient has been advised to f/u with their PCP in 1-2 weeks to bring them up to date on their rehab stay.  Social services at facility was responsible for arranging this appointment.  Pt was provided with a 30 day supply of prescriptions for medications and refills must be obtained from their PCP.  For controlled substances, a more limited supply may be provided adequate until PCP appointment only.  Medications have been reconciled.  Time spent greater than 30 minutes;> 50% of time with patient was  spent reviewing records, labs, tests and studies, counseling and developing plan of care  Noah Delaine. Sheppard Coil, MD

## 2017-03-25 ENCOUNTER — Emergency Department (HOSPITAL_BASED_OUTPATIENT_CLINIC_OR_DEPARTMENT_OTHER)
Admission: EM | Admit: 2017-03-25 | Discharge: 2017-03-25 | Disposition: A | Discharge status: Home / Self Care | Payer: Medicare Other | Attending: Emergency Medicine | Admitting: Emergency Medicine

## 2017-03-25 ENCOUNTER — Encounter (HOSPITAL_BASED_OUTPATIENT_CLINIC_OR_DEPARTMENT_OTHER): Payer: Self-pay

## 2017-03-25 ENCOUNTER — Emergency Department (HOSPITAL_BASED_OUTPATIENT_CLINIC_OR_DEPARTMENT_OTHER): Payer: Medicare Other

## 2017-03-25 DIAGNOSIS — Y998 Other external cause status: Secondary | ICD-10-CM | POA: Insufficient documentation

## 2017-03-25 DIAGNOSIS — Z8572 Personal history of non-Hodgkin lymphomas: Secondary | ICD-10-CM | POA: Diagnosis not present

## 2017-03-25 DIAGNOSIS — M542 Cervicalgia: Secondary | ICD-10-CM | POA: Diagnosis not present

## 2017-03-25 DIAGNOSIS — Z7982 Long term (current) use of aspirin: Secondary | ICD-10-CM | POA: Insufficient documentation

## 2017-03-25 DIAGNOSIS — W19XXXA Unspecified fall, initial encounter: Secondary | ICD-10-CM | POA: Diagnosis not present

## 2017-03-25 DIAGNOSIS — Y9301 Activity, walking, marching and hiking: Secondary | ICD-10-CM | POA: Insufficient documentation

## 2017-03-25 DIAGNOSIS — Z23 Encounter for immunization: Secondary | ICD-10-CM | POA: Insufficient documentation

## 2017-03-25 DIAGNOSIS — Z79899 Other long term (current) drug therapy: Secondary | ICD-10-CM | POA: Insufficient documentation

## 2017-03-25 DIAGNOSIS — E039 Hypothyroidism, unspecified: Secondary | ICD-10-CM | POA: Insufficient documentation

## 2017-03-25 DIAGNOSIS — S01311A Laceration without foreign body of right ear, initial encounter: Secondary | ICD-10-CM

## 2017-03-25 DIAGNOSIS — I10 Essential (primary) hypertension: Secondary | ICD-10-CM | POA: Insufficient documentation

## 2017-03-25 DIAGNOSIS — R51 Headache: Secondary | ICD-10-CM | POA: Insufficient documentation

## 2017-03-25 DIAGNOSIS — Y92019 Unspecified place in single-family (private) house as the place of occurrence of the external cause: Secondary | ICD-10-CM | POA: Insufficient documentation

## 2017-03-25 DIAGNOSIS — S0990XA Unspecified injury of head, initial encounter: Secondary | ICD-10-CM | POA: Diagnosis present

## 2017-03-25 MED ORDER — BACITRACIN ZINC 500 UNIT/GM EX OINT
TOPICAL_OINTMENT | Freq: Two times a day (BID) | CUTANEOUS | Status: DC
  Administered 2017-03-25: 1 via TOPICAL

## 2017-03-25 MED ORDER — BACITRACIN ZINC 500 UNIT/GM EX OINT: 1.0000 "application " | TOPICAL_OINTMENT | Freq: Two times a day (BID) | CUTANEOUS | 0 refills | Status: AC

## 2017-03-25 MED ORDER — LIDOCAINE-EPINEPHRINE (PF) 2 %-1:200000 IJ SOLN
10.0000 mL | Freq: Once | INTRAMUSCULAR | Status: AC
  Administered 2017-03-25: 10 mL
  Filled 2017-03-25: qty 10

## 2017-03-25 MED ORDER — TETANUS-DIPHTH-ACELL PERTUSSIS 5-2.5-18.5 LF-MCG/0.5 IM SUSP
0.5000 mL | Freq: Once | INTRAMUSCULAR | Status: AC
  Administered 2017-03-25: 0.5 mL via INTRAMUSCULAR
  Filled 2017-03-25: qty 0.5

## 2017-03-25 NOTE — ED Provider Notes (Addendum)
[facility] EMERGENCY DEPARTMENT Provider Note   CSN: [id number] Arrival date & time: 03/25/17  0516     History   Chief Complaint Chief Complaint  Patient presents with  . Fall    HPI Judith Clarke is a 83 y.o. female.  HPI Patient comes in with chief complaint of fall. Patient has multiple medical problems including cardiac dysrhythmia and foot drop.  Patient reports that she was walking to the bathroom and had a fall.  Patient is absolutely sure that her fall was not a result of syncope.  Patient has bleeding from her right ear.  Patient has a mild headache and some neck pain.  She denies any severe pain elsewhere.  The patient denies loss of consciousness, seizures, nausea or vomiting.  Past Medical History:  Diagnosis Date  . Acquired hypothyroidism 10/18/2015  . Age related osteoporosis 12/29/2015  . Anemia in neoplastic disease 12/29/2015  . Anosmia 12/29/2015  . Arthritis   . Benign neoplasm of colon 12/29/2015  . Bilateral foot-drop 10/18/2015  . Cancer ([place])   . Cardiac dysrhythmia 10/24/2012  . DDD (degenerative disc disease), lumbosacral 10/24/2012  . Functional dyspepsia 12/29/2015  . Generalized anxiety disorder 12/29/2015  . GERD (gastroesophageal reflux disease)   . High cholesterol   . Hypercholesterolemia 12/29/2015  . Hypertension   . Iron deficiency anemia 10/18/2015  . Malignant lymphoma ([place]) 12/29/2015  . Neuropathy, peripheral, idiopathic 11/28/2011  . Osteoarthritis, generalized 12/29/2015  . Polyneuropathy 12/29/2015  . S/P colectomy 10/18/2015  . Scoliosis 12/29/2015  . Thoracic or lumbosacral neuritis or radiculitis 10/24/2012  . Thyroid disease     Patient Active Problem List   Diagnosis Date Noted  . Compression fracture of L1 lumbar vertebra ([place]) 12/30/2016  . Multiple myeloma ([place]) 12/30/2016  . GERD (gastroesophageal reflux disease) 12/30/2016  . Osteoporosis with fracture 12/29/2015  . Anemia in neoplastic disease 12/29/2015  . Anosmia  12/29/2015  . Benign neoplasm of colon 12/29/2015  . Functional dyspepsia 12/29/2015  . Generalized anxiety disorder 12/29/2015  . Hypercholesterolemia 12/29/2015  . Malignant lymphoma ([place]) 12/29/2015  . Osteoarthritis, generalized 12/29/2015  . Polyneuropathy 12/29/2015  . Scoliosis 12/29/2015  . Acquired hypothyroidism 10/18/2015  . Bilateral foot-drop 10/18/2015  . Iron deficiency anemia 10/18/2015  . S/P colectomy 10/18/2015  . Cardiac dysrhythmia 10/24/2012  . DDD (degenerative disc disease), lumbosacral 10/24/2012  . Essential hypertension 10/24/2012  . Thoracic or lumbosacral neuritis or radiculitis 10/24/2012  . Neuropathy, peripheral, idiopathic 11/28/2011    Past Surgical History:  Procedure Laterality Date  . ABDOMINAL HYSTERECTOMY    . CATARACT EXTRACTION W/ INTRAOCULAR LENS IMPLANT  2000  . [place]  . [place]  . MASTOIDECTOMY  1933  . PARTIAL COLECTOMY  1993    OB History    No data available       Home Medications    Prior to Admission medications   Medication Sig Start Date End Date Taking? Authorizing Provider  Ascorbic Acid (VITAMIN C WITH ROSE HIPS) 1000 MG tablet Take 500 mg by mouth daily.    [provider]  aspirin EC 81 MG tablet Take 81 mg by mouth daily.    [provider]  bacitracin ointment Apply 1 application 2 (two) times daily topically. 03/25/17   Varney Biles, MD  Calcium Carb-Cholecalciferol (CALCIUM-VITAMIN D) 500-200 MG-UNIT tablet Take 1 tablet by mouth. Take one tablet daily with lunch    [provider]  cyanocobalamin (TH VITAMIN B12) 100 MCG  tablet Take by mouth. Take one tablet daily for Vitamin B-12    [provider]  ferrous sulfate 325 (65 FE) MG tablet Take 325 mg by mouth. Take one tablet on Mon, Wed, Fri, Sat.    [provider]  gabapentin (NEURONTIN) 600 MG tablet Take 600 mg by mouth. Take one tablet four times daily    [provider]  HYDROcodone-acetaminophen (NORCO/VICODIN) 5-325 MG tablet Take 1 tablet by mouth. Take one tablet every 6 hours as needed for up to 5 days . Take one tablet every 8 hours for pain routinely 12/27/16   [provider]  ibuprofen (ADVIL,MOTRIN) 400 MG tablet Take 400 mg by mouth. Take one tablet every 8 hours as needed for pain    [provider]  levothyroxine (SYNTHROID, LEVOTHROID) 50 MCG tablet Take 50 mcg by mouth daily before breakfast.    [provider]  methocarbamol (ROBAXIN) 500 MG tablet Take 500 mg by mouth. Take one tablet twice daily for muscle spasms    [provider]  metoprolol succinate (TOPROL-XL) 25 MG 24 hr tablet Take 12.5 mg by mouth. Take  once daily    [provider]  Multiple Vitamin (MULTI-VITAMINS) TABS Take by mouth. Take one tablet daily    [provider]  omeprazole (PRILOSEC) 20 MG capsule Take 20 mg by mouth daily.    [provider]  potassium chloride SA (K-DUR,KLOR-CON) 20 MEQ tablet Take 20 mEq by mouth. Take one tablet once daily    [provider]  saccharomyces boulardii (FLORASTOR) 250 MG capsule Take 250 mg by mouth. Take one capsule every evening    [provider]  simvastatin (ZOCOR) 10 MG tablet Take 10 mg by mouth. Take one tablet nightly for cholesterol 12/02/16   [provider]    Family History Family History  Problem Relation Age of Onset  . Cancer Mother   . Cancer Father   . Heart disease Brother   . Stroke Brother     Social History Social History   Tobacco Use  . Smoking status: Never Smoker  . Smokeless tobacco: Never Used  Substance Use Topics  . Alcohol use: No  . Drug use: No     Allergies   Duloxetine and Pregabalin   Review of Systems Review of Systems  Constitutional: Negative for activity change.  Musculoskeletal: Positive for myalgias and neck pain.  Skin: Positive for wound.  Hematological: Does not bruise/bleed  easily.     Physical Exam Updated Vital Signs BP 129/82   Pulse 72   Temp 98 F (36.7 C) (Oral)   Resp 17   Ht '4\' 11"'  (1.499 m)   Wt 62.1 kg (137 lb)   SpO2 95%   BMI 27.67 kg/m   Physical Exam  Constitutional: She appears well-developed.  HENT:  Head: Normocephalic and atraumatic.  Eyes: EOM are normal.  Neck: Normal range of motion. Neck supple.  Patient has a 3 cm laceration behind the right earlobe.  + upper cspine midline tenderness  Cardiovascular: Normal rate.  Pulmonary/Chest: Effort normal.  Abdominal: Bowel sounds are normal.  Musculoskeletal:  Head to toe evaluation shows no hematoma, bleeding of the scalp, no facial abrasions, no spine step offs, crepitus of the chest or neck, no tenderness to palpation of the bilateral upper and lower extremities, no gross deformities, no chest tenderness, no pelvic pain.   Neurological: She is alert.  Skin: Skin is warm and dry.  Nursing note and  vitals reviewed.    ED Treatments / Results  Labs (all labs ordered are listed, but only abnormal results are displayed) Labs Reviewed - No data to display  EKG  EKG Interpretation None       Radiology Ct Head Wo Contrast  Result Date: 03/25/2017 CLINICAL DATA:  [place] this morning, striking the head. Laceration to the right ear. EXAM: CT HEAD WITHOUT CONTRAST CT CERVICAL SPINE WITHOUT CONTRAST TECHNIQUE: Multidetector CT imaging of the head and cervical spine was performed following the standard protocol without intravenous contrast. Multiplanar CT image reconstructions of the cervical spine were also generated. COMPARISON:  None. FINDINGS: CT HEAD FINDINGS Brain: Diffuse cerebral atrophy. Ventricular dilatation likely due to central atrophy. Low-attenuation changes throughout the deep white matter consistent with small vessel ischemia. No mass effect or midline shift. No abnormal extra-axial fluid collections. Gray-white matter junctions are distinct. Basal cisterns are not  effaced. No acute intracranial hemorrhage. Vascular: Internal carotid artery vascular calcifications. Skull: Calvarium appears intact. Sinuses/Orbits: Paranasal sinuses and mastoid air cells are clear. Other: None. CT CERVICAL SPINE FINDINGS Alignment: Straightening of usual cervical lordosis. This may be due to patient positioning but ligamentous injury or muscle spasm could also have this appearance and are not excluded. No anterior subluxation. Normal alignment of the facet joints. C1-2 articulation appears intact. Skull base and vertebrae: No acute fracture. No primary bone lesion or focal pathologic process. Soft tissues and spinal canal: No prevertebral fluid or swelling. No visible canal hematoma. Disc levels: Diffuse degenerative change throughout the cervical spine with narrowed interspaces and endplate hypertrophic changes throughout. Degenerative changes in the facet joints. Upper chest: Emphysematous changes suggested in the lung apices. Other: None. IMPRESSION: 1. No acute intracranial abnormalities. Chronic atrophy and small vessel ischemic changes. 2. Nonspecific straightening of usual cervical lordosis. Degenerative changes in the cervical spine. No acute displaced fractures identified. Electronically Signed   By: Lucienne Capers M.D.   On: 03/25/2017 06:51   Ct Cervical Spine Wo Contrast  Result Date: 03/25/2017 CLINICAL DATA:  [place] this morning, striking the head. Laceration to the right ear. EXAM: CT HEAD WITHOUT CONTRAST CT CERVICAL SPINE WITHOUT CONTRAST TECHNIQUE: Multidetector CT imaging of the head and cervical spine was performed following the standard protocol without intravenous contrast. Multiplanar CT image reconstructions of the cervical spine were also generated. COMPARISON:  None. FINDINGS: CT HEAD FINDINGS Brain: Diffuse cerebral atrophy. Ventricular dilatation likely due to central atrophy. Low-attenuation changes throughout the deep white matter consistent with small vessel  ischemia. No mass effect or midline shift. No abnormal extra-axial fluid collections. Gray-white matter junctions are distinct. Basal cisterns are not effaced. No acute intracranial hemorrhage. Vascular: Internal carotid artery vascular calcifications. Skull: Calvarium appears intact. Sinuses/Orbits: Paranasal sinuses and mastoid air cells are clear. Other: None. CT CERVICAL SPINE FINDINGS Alignment: Straightening of usual cervical lordosis. This may be due to patient positioning but ligamentous injury or muscle spasm could also have this appearance and are not excluded. No anterior subluxation. Normal alignment of the facet joints. C1-2 articulation appears intact. Skull base and vertebrae: No acute fracture. No primary bone lesion or focal pathologic process. Soft tissues and spinal canal: No prevertebral fluid or swelling. No visible canal hematoma. Disc levels: Diffuse degenerative change throughout the cervical spine with narrowed interspaces and endplate hypertrophic changes throughout. Degenerative changes in the facet joints. Upper chest: Emphysematous changes suggested in the lung apices. Other: None. IMPRESSION: 1. No acute intracranial abnormalities. Chronic atrophy and small vessel ischemic changes. 2. Nonspecific straightening  of usual cervical lordosis. Degenerative changes in the cervical spine. No acute displaced fractures identified. Electronically Signed   By: Lucienne Capers M.D.   On: 03/25/2017 06:51    Procedures .Marland KitchenLaceration Repair Date/Time: 03/25/2017 7:35 AM Performed by: Varney Biles, MD Authorized by: Varney Biles, MD   Consent:    Consent obtained:  Verbal   Consent given by:  Patient   Risks discussed:  Infection, pain, poor cosmetic result, need for additional repair and poor wound healing (ischemia)   Alternatives discussed:  No treatment Anesthesia (see MAR for exact dosages):    Anesthesia method:  Local infiltration Laceration details:    Location:  Ear    Ear location:  L ear   Length (cm):  5 Repair type:    Repair type:  Simple Pre-procedure details:    Preparation:  Patient was prepped and draped in usual sterile fashion Treatment:    Area cleansed with:  Saline and soap and water   Amount of cleaning:  Extensive   Irrigation solution:  Sterile saline   Visualized foreign bodies/material removed: no   Skin repair:    Repair method:  Sutures   Suture size:  4-0   Suture material:  Chromic gut   Suture technique:  Simple interrupted   Number of sutures:  8 Approximation:    Approximation:  Close   [place] border: well-aligned     (including critical care time)  Medications Ordered in ED Medications  bacitracin ointment (1 application Topical Given 03/25/17 0736)  Tdap (BOOSTRIX) injection 0.5 mL (0.5 mLs Intramuscular Given 03/25/17 0606)  lidocaine-EPINEPHrine (XYLOCAINE W/EPI) 2 %-1:200000 (PF) injection 10 mL (10 mLs Infiltration Given 03/25/17 1025)     Initial Impression / Assessment and Plan / ED Course  I have reviewed the triage vital signs and the nursing notes.  Pertinent labs & imaging results that were available during my care of the patient were reviewed by me and considered in my medical decision making (see chart for details).    Patient comes in with chief complaint of fall  DDx includes: - Mechanical falls - ICH - Fractures - Contusions - Soft tissue injury  Patient is noted to have C-spine tenderness and right ear laceration.  CT head and C-spine ordered because we cannot clinically clear them.  Patient has no tenderness over the chest, abdomen, pelvic region or extremities.  Laceration will be repaired.  Tetanus will be updated.   Final Clinical Impressions(s) / ED Diagnoses   Final diagnoses:  Fall, initial encounter  Complex laceration of right ear, initial encounter    ED Discharge Orders        Ordered    bacitracin ointment  2 times daily     03/25/17 [place], [place],  MD 03/25/17 [place], [place], MD 03/25/17 [phone]

## 2017-03-25 NOTE — ED Notes (Signed)
ED Provider at bedside. 

## 2017-03-25 NOTE — ED Notes (Signed)
Patient transported to CT 

## 2017-03-25 NOTE — Discharge Instructions (Signed)
Please see your primary doctor for a wound recheck in 2 days or come to the ER for it. If the wound is not taking then we will have to get you to see an plastic surgery or ENT doctor promptly.

## 2017-03-25 NOTE — ED Triage Notes (Addendum)
Pt presents to ED after a fall this am. Pt has drop foot and reports she "fell so quickly". Pt c/o back pain- hx of back issues. Had "epidural injection" last week. Pt also has 4cm lac to right ear. Pt denies LOC. Bleeding controlled.

## 2017-03-25 NOTE — ED Notes (Signed)
Pt verbalized understanding of discharge instructions and denies any further questions at this time.   

## 2018-08-21 ENCOUNTER — Encounter (HOSPITAL_BASED_OUTPATIENT_CLINIC_OR_DEPARTMENT_OTHER): Payer: Self-pay | Admitting: *Deleted

## 2018-08-21 ENCOUNTER — Emergency Department (HOSPITAL_BASED_OUTPATIENT_CLINIC_OR_DEPARTMENT_OTHER)
Admission: EM | Admit: 2018-08-21 | Discharge: 2018-08-21 | Disposition: A | Discharge status: Home / Self Care | Payer: Medicare Other | Attending: Emergency Medicine | Admitting: Emergency Medicine

## 2018-08-21 ENCOUNTER — Other Ambulatory Visit: Payer: Self-pay

## 2018-08-21 DIAGNOSIS — I10 Essential (primary) hypertension: Secondary | ICD-10-CM | POA: Diagnosis not present

## 2018-08-21 DIAGNOSIS — Z79899 Other long term (current) drug therapy: Secondary | ICD-10-CM | POA: Insufficient documentation

## 2018-08-21 DIAGNOSIS — J029 Acute pharyngitis, unspecified: Secondary | ICD-10-CM

## 2018-08-21 DIAGNOSIS — Z7982 Long term (current) use of aspirin: Secondary | ICD-10-CM | POA: Diagnosis not present

## 2018-08-21 DIAGNOSIS — E039 Hypothyroidism, unspecified: Secondary | ICD-10-CM | POA: Insufficient documentation

## 2018-08-21 DIAGNOSIS — Z8579 Personal history of other malignant neoplasms of lymphoid, hematopoietic and related tissues: Secondary | ICD-10-CM | POA: Insufficient documentation

## 2018-08-21 LAB — GROUP A STREP BY PCR: Group A Strep by PCR: NOT DETECTED

## 2018-08-21 NOTE — ED Triage Notes (Signed)
Sore throat off and on for a week.

## 2018-08-21 NOTE — ED Provider Notes (Signed)
[facility] EMERGENCY DEPARTMENT Provider Note   CSN: [id number] Arrival date & time: 08/21/18  1931    History   Chief Complaint Chief Complaint  Patient presents with  . Sore Throat    HPI Judith Clarke is a 85 y.o. adult.     Patient is a 85 year old female who presents with a sore throat.  She states she just has not felt quite well for about a week.  She had a little bit of cough earlier but no cough now.  No runny nose or congestion.  No known fevers.  No vomiting or diarrhea.  She says her throat is sore and it hurts when she swallows.  No rashes.  No abdominal pain.  No chest pain or shortness of breath.  She has not been taking anything for the symptoms.     Past Medical History:  Diagnosis Date  . Acquired hypothyroidism 10/18/2015  . Age related osteoporosis 12/29/2015  . Anemia in neoplastic disease 12/29/2015  . Anosmia 12/29/2015  . Arthritis   . Benign neoplasm of colon 12/29/2015  . Bilateral foot-drop 10/18/2015  . Cancer ([place])   . Cardiac dysrhythmia 10/24/2012  . DDD (degenerative disc disease), lumbosacral 10/24/2012  . Functional dyspepsia 12/29/2015  . Generalized anxiety disorder 12/29/2015  . GERD (gastroesophageal reflux disease)   . High cholesterol   . Hypercholesterolemia 12/29/2015  . Hypertension   . Iron deficiency anemia 10/18/2015  . Malignant lymphoma ([place]) 12/29/2015  . Neuropathy, peripheral, idiopathic 11/28/2011  . Osteoarthritis, generalized 12/29/2015  . Polyneuropathy 12/29/2015  . S/P colectomy 10/18/2015  . Scoliosis 12/29/2015  . Thoracic or lumbosacral neuritis or radiculitis 10/24/2012  . Thyroid disease     Patient Active Problem List   Diagnosis Date Noted  . Compression fracture of L1 lumbar vertebra ([place]) 12/30/2016  . Multiple myeloma ([place]) 12/30/2016  . GERD (gastroesophageal reflux disease) 12/30/2016  . Osteoporosis with fracture 12/29/2015  . Anemia in neoplastic disease 12/29/2015  . Anosmia 12/29/2015  . Benign  neoplasm of colon 12/29/2015  . Functional dyspepsia 12/29/2015  . Generalized anxiety disorder 12/29/2015  . Hypercholesterolemia 12/29/2015  . Malignant lymphoma ([place]) 12/29/2015  . Osteoarthritis, generalized 12/29/2015  . Polyneuropathy 12/29/2015  . Scoliosis 12/29/2015  . Acquired hypothyroidism 10/18/2015  . Bilateral foot-drop 10/18/2015  . Iron deficiency anemia 10/18/2015  . S/P colectomy 10/18/2015  . Cardiac dysrhythmia 10/24/2012  . DDD (degenerative disc disease), lumbosacral 10/24/2012  . Essential hypertension 10/24/2012  . Thoracic or lumbosacral neuritis or radiculitis 10/24/2012  . Neuropathy, peripheral, idiopathic 11/28/2011    Past Surgical History:  Procedure Laterality Date  . ABDOMINAL HYSTERECTOMY    . CATARACT EXTRACTION W/ INTRAOCULAR LENS IMPLANT  2000  . [place]  . [place]  . MASTOIDECTOMY  1933  . PARTIAL COLECTOMY  1993     OB History   No obstetric history on file.      Home Medications    Prior to Admission medications   Medication Sig Start Date End Date Taking? Authorizing Provider  Ascorbic Acid (VITAMIN C WITH ROSE HIPS) 1000 MG tablet Take 500 mg by mouth daily.    [provider]  aspirin EC 81 MG tablet Take 81 mg by mouth daily.    [provider]  bacitracin ointment Apply 1 application 2 (two) times daily topically. 03/25/17   Varney Biles, MD  Calcium Carb-Cholecalciferol (CALCIUM-VITAMIN D) 500-200 MG-UNIT tablet Take 1 tablet by mouth. Take one tablet daily with  lunch    [provider]  cyanocobalamin (TH VITAMIN B12) 100 MCG tablet Take by mouth. Take one tablet daily for Vitamin B-12    [provider]  ferrous sulfate 325 (65 FE) MG tablet Take 325 mg by mouth. Take one tablet on Mon, Wed, Fri, Sat.    [provider]  gabapentin (NEURONTIN) 600 MG tablet Take 600 mg by mouth. Take one tablet four times daily    [provider]   HYDROcodone-acetaminophen (NORCO/VICODIN) 5-325 MG tablet Take 1 tablet by mouth. Take one tablet every 6 hours as needed for up to 5 days . Take one tablet every 8 hours for pain routinely 12/27/16   [provider]  ibuprofen (ADVIL,MOTRIN) 400 MG tablet Take 400 mg by mouth. Take one tablet every 8 hours as needed for pain    [provider]  levothyroxine (SYNTHROID, LEVOTHROID) 50 MCG tablet Take 50 mcg by mouth daily before breakfast.    [provider]  methocarbamol (ROBAXIN) 500 MG tablet Take 500 mg by mouth. Take one tablet twice daily for muscle spasms    [provider]  metoprolol succinate (TOPROL-XL) 25 MG 24 hr tablet Take 12.5 mg by mouth. Take  once daily    [provider]  Multiple Vitamin (MULTI-VITAMINS) TABS Take by mouth. Take one tablet daily    [provider]  omeprazole (PRILOSEC) 20 MG capsule Take 20 mg by mouth daily.    [provider]  potassium chloride SA (K-DUR,KLOR-CON) 20 MEQ tablet Take 20 mEq by mouth. Take one tablet once daily    [provider]  saccharomyces boulardii (FLORASTOR) 250 MG capsule Take 250 mg by mouth. Take one capsule every evening    [provider]  simvastatin (ZOCOR) 10 MG tablet Take 10 mg by mouth. Take one tablet nightly for cholesterol 12/02/16   [provider]    Family History Family History  Problem Relation Age of Onset  . Cancer Mother   . Cancer Father   . Heart disease Brother   . Stroke Brother     Social History Social History   Tobacco Use  . Smoking status: Never Smoker  . Smokeless tobacco: Never Used  Substance Use Topics  . Alcohol use: No  . Drug use: No     Allergies   Duloxetine and Pregabalin   Review of Systems Review of Systems  Constitutional: Positive for fatigue. Negative for chills, diaphoresis and fever.  HENT: Positive for sore throat. Negative for congestion, rhinorrhea and sneezing.   Eyes:  Negative.   Respiratory: Negative for cough, chest tightness and shortness of breath.   Cardiovascular: Negative for chest pain and leg swelling.  Gastrointestinal: Negative for abdominal pain, blood in stool, diarrhea, nausea and vomiting.  Genitourinary: Negative for difficulty urinating, flank pain, frequency and hematuria.  Musculoskeletal: Negative for arthralgias and back pain.  Skin: Negative for rash.  Neurological: Negative for dizziness, speech difficulty, weakness, numbness and headaches.     Physical Exam Updated Vital Signs BP (!) 210/75   Pulse 72   Temp 98.7 F (37.1 C) (Oral)   Resp 20   Ht '4\' 11"'  (1.499 m)   SpO2 96%   BMI 27.67 kg/m   Physical Exam Constitutional:      Appearance: He is well-developed.  HENT:     Head: Normocephalic and atraumatic.     Right Ear: Tympanic membrane normal.     Left Ear: Tympanic membrane normal.  Mouth/Throat:     Pharynx: Posterior oropharyngeal erythema present.     Comments: Patient has some erythema to the posterior pharynx.  No exudates, uvula is midline, no trismus Eyes:     Pupils: Pupils are equal, round, and reactive to light.  Neck:     Musculoskeletal: Normal range of motion and neck supple.  Cardiovascular:     Rate and Rhythm: Normal rate and regular rhythm.     Heart sounds: Normal heart sounds.  Pulmonary:     Effort: Pulmonary effort is normal. No respiratory distress.     Breath sounds: Normal breath sounds. No wheezing or rales.  Chest:     Chest wall: No tenderness.  Abdominal:     General: Bowel sounds are normal.     Palpations: Abdomen is soft.     Tenderness: There is no abdominal tenderness. There is no guarding or rebound.  Musculoskeletal: Normal range of motion.  Lymphadenopathy:     Cervical: No cervical adenopathy.  Skin:    General: Skin is warm and dry.     Findings: No rash.  Neurological:     Mental Status: He is alert and oriented to person, place, and time.      ED  Treatments / Results  Labs (all labs ordered are listed, but only abnormal results are displayed) Labs Reviewed  GROUP A STREP BY PCR    EKG None  Radiology No results found.  Procedures Procedures (including critical care time)  Medications Ordered in ED Medications - No data to display   Initial Impression / Assessment and Plan / ED Course  I have reviewed the triage vital signs and the nursing notes.  Pertinent labs & imaging results that were available during my care of the patient were reviewed by me and considered in my medical decision making (see chart for details).        Patient is a 85 year old female who presents with sore throat.  She has some mild erythema to the posterior pharynx.  Her strep test is negative.  She does not have fever or other symptoms that would be more concerning for coated.  Her lungs are clear without suggestions of pneumonia.  She has normal oxygen saturations and no reports of shortness of breath.  She was discharged home in good condition.  Her blood pressure is elevated but on recheck it was 322 systolic.  She is asymptomatic from this.  I discussed this finding with her daughter who says that she gets very anxious in healthcare settings and that is likely the reason her blood pressure is elevated.  I advised her daughter to keep an eye on it at home and follow-up with her PCP if her symptoms continue.  Return precautions were given.  Judith Clarke was evaluated in Emergency Department on 08/21/2018 for the symptoms described in the history of present illness. He was evaluated in the context of the global COVID-19 pandemic, which necessitated consideration that the patient might be at risk for infection with the SARS-CoV-2 virus that causes COVID-19. Institutional protocols and algorithms that pertain to the evaluation of patients at risk for COVID-19 are in a state of rapid change based on information released by regulatory bodies including the CDC  and federal and state organizations. These policies and algorithms were followed during the patient's care in the ED.Marland Kitchen  Final Clinical Impressions(s) / ED Diagnoses   Final diagnoses:  Sore throat    ED Discharge Orders    None  Malvin Johns, MD 08/21/18 2112

## 2018-08-21 NOTE — ED Notes (Signed)
Dr. Tamera Punt Spoke with Pt. Daughter about the Pt. Negative strep test.  Also spoke with the daughter about the Pt. Blood pressure and checking it.

## 2018-12-10 IMAGING — CT CT CERVICAL SPINE W/O CM
4 of 7 series · 13 of 33 positions shown, 14 images · non-contrast
Comparison: None.

CLINICAL DATA: Fell this morning, striking the head. Laceration to
the right ear.

EXAM:
CT HEAD WITHOUT CONTRAST
CT CERVICAL SPINE WITHOUT CONTRAST
TECHNIQUE: Multidetector CT imaging of the head and cervical spine was
performed following the standard protocol without intravenous
contrast. Multiplanar CT image reconstructions of the cervical spine
were also generated.

[Series 3: c_spine 2.0 i30s 3 · axial · 0.25mm/px · z∈[-286,-214]mm · 3 of 74 slices shown]
[im 19/74  bone]
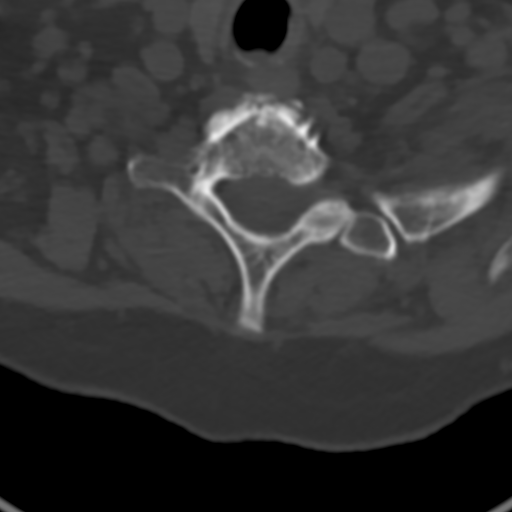
[im 37/74  bone]
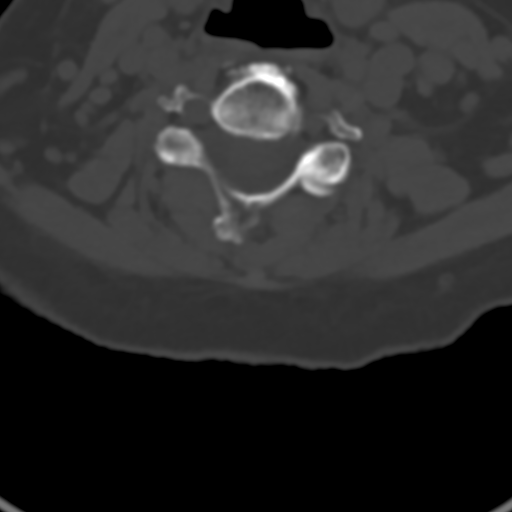
[im 55/74  bone]
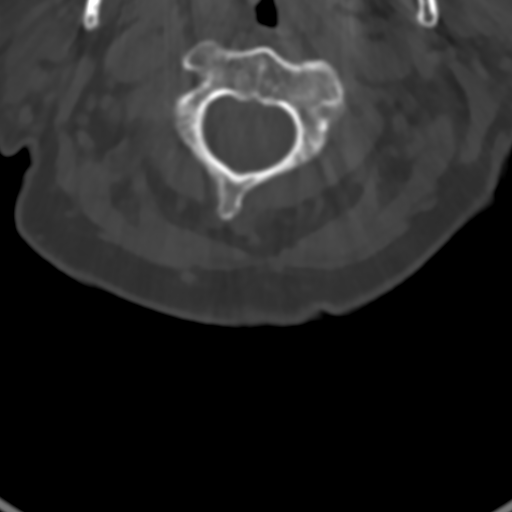

[Series 6: head 2.0 mpr cor · coronal · 0.31mm/px · 2 of 101 slices shown]
[im 34/101  bone]
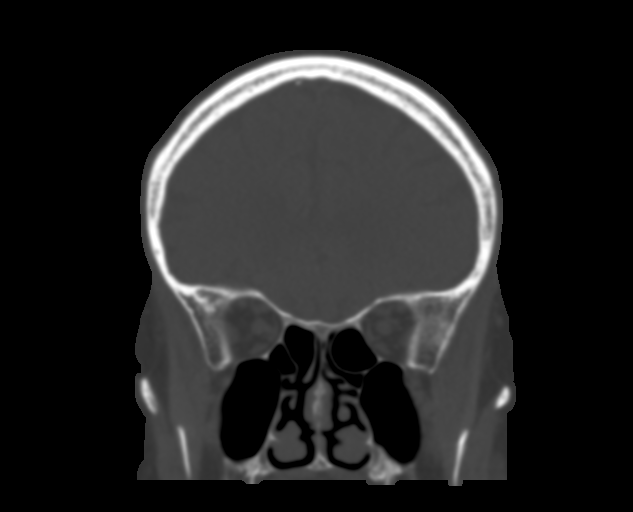
[im 67/101  bone]
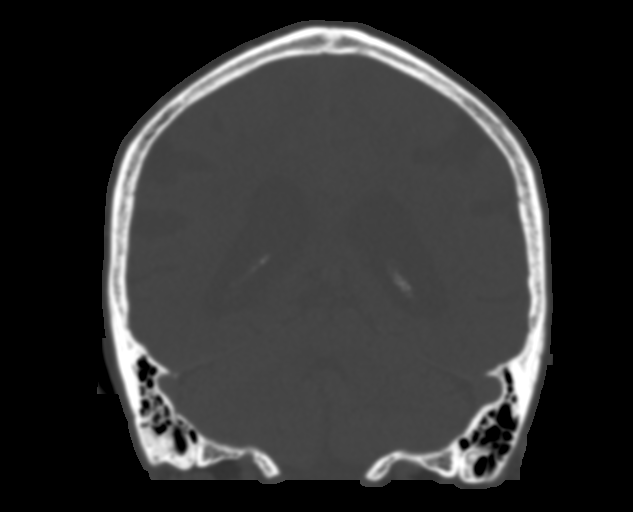

[Series 8: sagittal c-sp · sagittal · 0.24mm/px · 4 of 37 slices shown]
[im 8/37  bone]
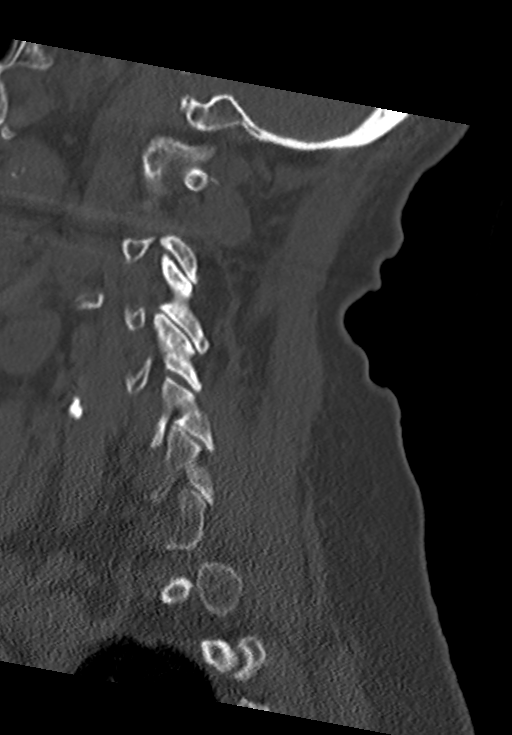
[im 15/37  bone]
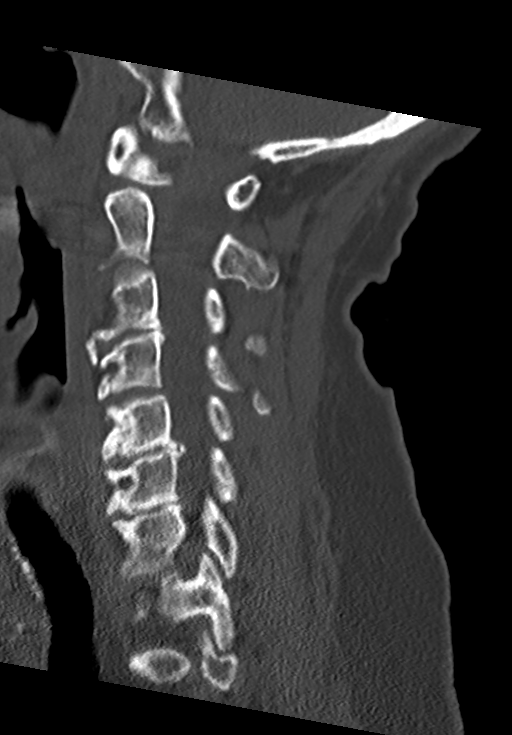
[im 22/37  bone]
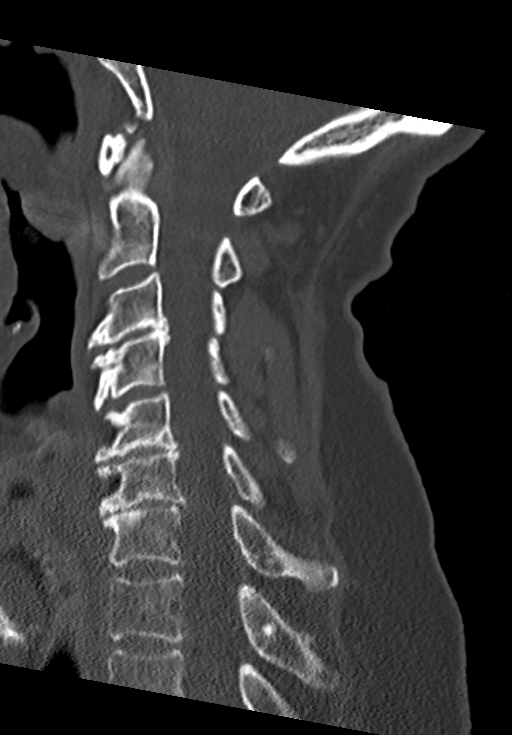
[im 29/37  bone]
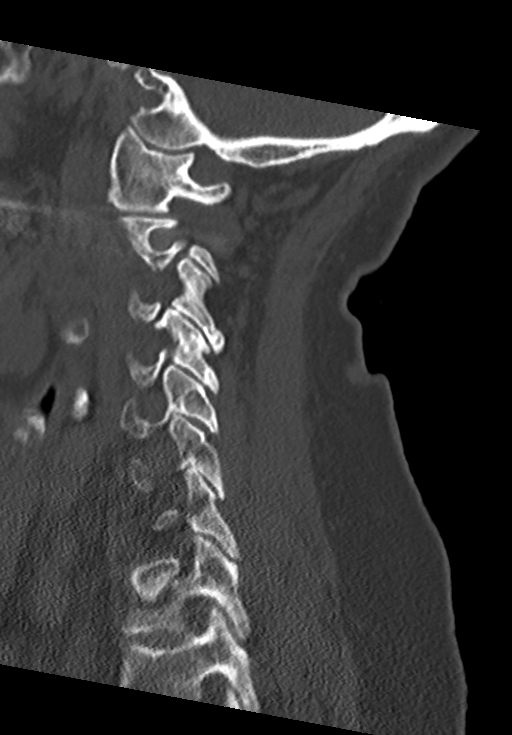

[Series 9: orthogonal · axial · 0.23mm/px · z∈[-315,-216]mm · 4 of 86 slices shown, 5 images]
[im 18/86  soft-tissue]
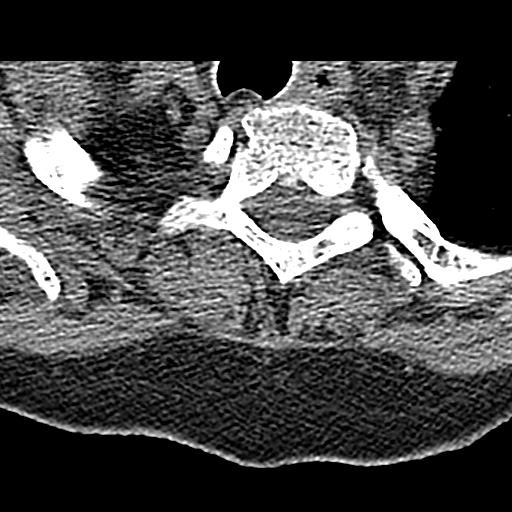
[im 18/86  bone]
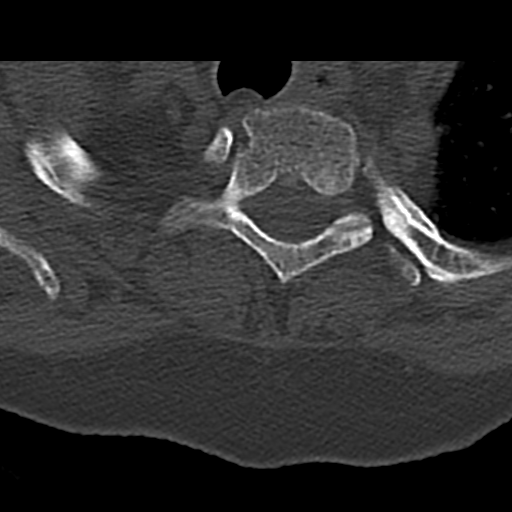
[im 35/86  bone]
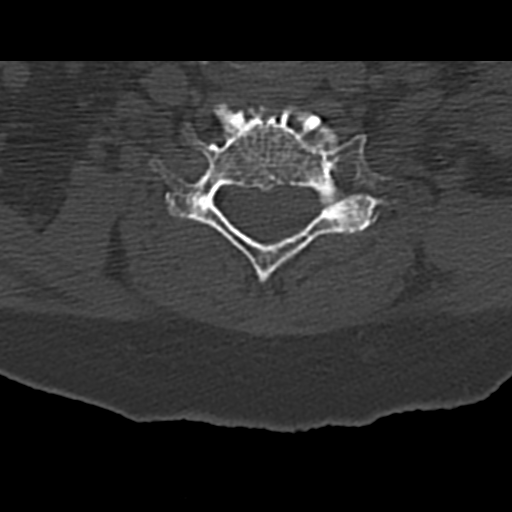
[im 52/86  bone]
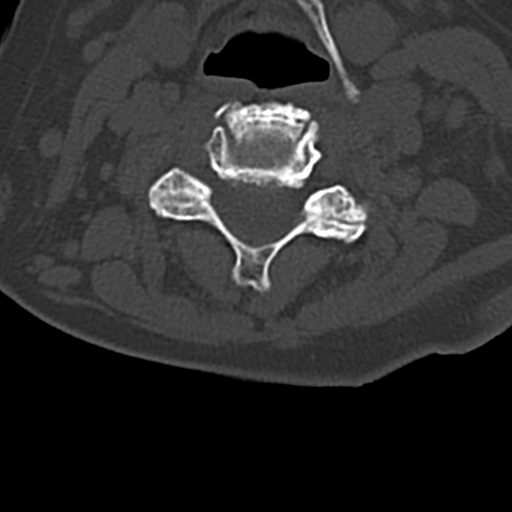
[im 69/86  bone]
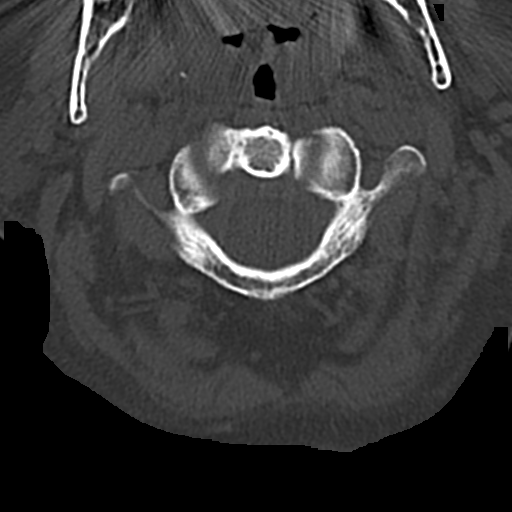

[13 of 33 positions shown; findings below may reference images not displayed]

FINDINGS: CT HEAD FINDINGS

Brain: Diffuse cerebral atrophy. Ventricular dilatation likely due
to central atrophy. Low-attenuation changes throughout the deep
white matter consistent with small vessel ischemia. No mass effect
or midline shift. No abnormal extra-axial fluid collections.
Gray-white matter junctions are distinct. Basal cisterns are not
effaced. No acute intracranial hemorrhage.

Vascular: Internal carotid artery vascular calcifications.

Skull: Calvarium appears intact.

Sinuses/Orbits: Paranasal sinuses and mastoid air cells are clear.

Other: None.

CT CERVICAL SPINE FINDINGS

Alignment: Straightening of usual cervical lordosis. This may be due
to patient positioning but ligamentous injury or muscle spasm could
also have this appearance and are not excluded. No anterior
subluxation. Normal alignment of the facet joints. C1-2 articulation
appears intact.

Skull base and vertebrae: No acute fracture. No primary bone lesion
or focal pathologic process.

Soft tissues and spinal canal: No prevertebral fluid or swelling. No
visible canal hematoma.

Disc levels: Diffuse degenerative change throughout the cervical
spine with narrowed interspaces and endplate hypertrophic changes
throughout. Degenerative changes in the facet joints.

Upper chest: Emphysematous changes suggested in the lung apices.

Other: None.
IMPRESSION: 1. No acute intracranial abnormalities. Chronic atrophy and small
vessel ischemic changes.
2. Nonspecific straightening of usual cervical lordosis.
Degenerative changes in the cervical spine. No acute displaced
fractures identified.

## 2019-08-26 ENCOUNTER — Emergency Department (HOSPITAL_BASED_OUTPATIENT_CLINIC_OR_DEPARTMENT_OTHER)
Admission: EM | Admit: 2019-08-26 | Discharge: 2019-08-27 | Disposition: A | Discharge status: Home / Self Care | Payer: Medicare Other | Attending: Emergency Medicine | Admitting: Emergency Medicine

## 2019-08-26 ENCOUNTER — Other Ambulatory Visit: Payer: Self-pay

## 2019-08-26 ENCOUNTER — Emergency Department (HOSPITAL_BASED_OUTPATIENT_CLINIC_OR_DEPARTMENT_OTHER): Payer: Medicare Other

## 2019-08-26 ENCOUNTER — Encounter (HOSPITAL_BASED_OUTPATIENT_CLINIC_OR_DEPARTMENT_OTHER): Payer: Self-pay

## 2019-08-26 DIAGNOSIS — R05 Cough: Secondary | ICD-10-CM | POA: Diagnosis present

## 2019-08-26 DIAGNOSIS — Z888 Allergy status to other drugs, medicaments and biological substances status: Secondary | ICD-10-CM | POA: Diagnosis not present

## 2019-08-26 DIAGNOSIS — R059 Cough, unspecified: Secondary | ICD-10-CM

## 2019-08-26 DIAGNOSIS — Z7982 Long term (current) use of aspirin: Secondary | ICD-10-CM | POA: Insufficient documentation

## 2019-08-26 DIAGNOSIS — Z79899 Other long term (current) drug therapy: Secondary | ICD-10-CM | POA: Insufficient documentation

## 2019-08-26 DIAGNOSIS — J189 Pneumonia, unspecified organism: Secondary | ICD-10-CM | POA: Diagnosis not present

## 2019-08-26 DIAGNOSIS — I10 Essential (primary) hypertension: Secondary | ICD-10-CM | POA: Diagnosis not present

## 2019-08-26 DIAGNOSIS — E782 Mixed hyperlipidemia: Secondary | ICD-10-CM | POA: Insufficient documentation

## 2019-08-26 LAB — COMPREHENSIVE METABOLIC PANEL
ALT: 13 U/L (ref 0–44)
AST: 21 U/L (ref 15–41)
Albumin: 3.9 g/dL (ref 3.5–5.0)
Alkaline Phosphatase: 67 U/L (ref 38–126)
Anion gap: 9 (ref 5–15)
BUN: 16 mg/dL (ref 8–23)
CO2: 22 mmol/L (ref 22–32)
Calcium: 9.2 mg/dL (ref 8.9–10.3)
Chloride: 111 mmol/L (ref 98–111)
Creatinine, Ser: 0.84 mg/dL (ref 0.44–1.00)
GFR calc Af Amer: >60 mL/min (ref >60)
GFR calc non Af Amer: >60 mL/min (ref >60)
Glucose, Bld: 112 mg/dL — ABNORMAL HIGH (ref 70–99)
Potassium: 4.1 mmol/L (ref 3.5–5.1)
Sodium: 142 mmol/L (ref 135–145)
Total Bilirubin: 0.8 mg/dL (ref 0.3–1.2)
Total Protein: 7.1 g/dL (ref 6.5–8.1)

## 2019-08-26 LAB — CBC WITH DIFFERENTIAL/PLATELET
Abs Immature Granulocytes: 0.07 10*3/uL (ref 0.00–0.07)
Basophils Absolute: 0.1 10*3/uL (ref 0.0–0.1)
Basophils Relative: 1 %
Eosinophils Absolute: 0.1 10*3/uL (ref 0.0–0.5)
Eosinophils Relative: 1 %
HCT: 42 % (ref 36.0–46.0)
Hemoglobin: 13.6 g/dL (ref 12.0–15.0)
Immature Granulocytes: 1 %
Lymphocytes Relative: 28 %
Lymphs Abs: 3.4 10*3/uL (ref 0.7–4.0)
MCH: 32.1 pg (ref 26.0–34.0)
MCHC: 32.4 g/dL (ref 30.0–36.0)
MCV: 99.1 fL (ref 80.0–100.0)
Monocytes Absolute: 0.8 10*3/uL (ref 0.1–1.0)
Monocytes Relative: 7 %
Neutro Abs: 7.7 10*3/uL (ref 1.7–7.7)
Neutrophils Relative %: 62 %
Platelets: 102 10*3/uL — ABNORMAL LOW (ref 150–400)
RBC: 4.24 MIL/uL (ref 3.87–5.11)
RDW: 13.2 % (ref 11.5–15.5)
WBC: 12.1 10*3/uL — ABNORMAL HIGH (ref 4.0–10.5)
nRBC: 0 % (ref 0.0–0.2)

## 2019-08-26 MED ORDER — AMOXICILLIN-POT CLAVULANATE 875-125 MG PO TABS: 1.0000 | ORAL_TABLET | Freq: Two times a day (BID) | ORAL | 0 refills | Status: AC

## 2019-08-26 MED ORDER — DOXYCYCLINE HYCLATE 100 MG PO CAPS: 100.0000 mg | ORAL_CAPSULE | Freq: Two times a day (BID) | ORAL | 0 refills | Status: AC

## 2019-08-26 MED ORDER — DOXYCYCLINE HYCLATE 100 MG PO TABS
100.0000 mg | ORAL_TABLET | Freq: Once | ORAL | Status: AC
  Administered 2019-08-26: 100 mg via ORAL
  Filled 2019-08-26: qty 1

## 2019-08-26 MED ORDER — AMOXICILLIN-POT CLAVULANATE 875-125 MG PO TABS
1.0000 | ORAL_TABLET | Freq: Once | ORAL | Status: AC
  Administered 2019-08-26: 1 via ORAL
  Filled 2019-08-26: qty 1

## 2019-08-26 NOTE — Discharge Instructions (Signed)
Please pick up antibiotics and take as prescribed Follow up with PCP in 1-2 days for recheck Return to the ED IMMEDIATELY for any worsening symptoms including worsening cough, persistent fevers after 48 hours on antibiotics, worsening shortness of breath, or any other associated symptoms

## 2019-08-26 NOTE — ED Provider Notes (Signed)
[facility] EMERGENCY DEPARTMENT Provider Note   CSN: [id number] Arrival date & time: 08/26/19  2012     History Chief Complaint  Patient presents with   Cough    Judith Clarke is a 86 y.o. adult who presents to the ED today with complaint of nasal congestion and cough x 3-4 days.  Reports that patient has also had a temperature of 100.4 today.  She was given Tylenol.  Temp on arrival 32 Fahrenheit.  Patient states she does not feel any more short of breath than she normally does.  She denies chest pain.  Has been giving her Mucinex with mild relief however just wanted her evaluated today.  Patient states she feels fine and she is ready to go home.  She has been vaccinated for COVID-19 in February.   The history is provided by the patient and a relative.       Past Medical History:  Diagnosis Date   Acquired hypothyroidism 10/18/2015   Age related osteoporosis 12/29/2015   Anemia in neoplastic disease 12/29/2015   Anosmia 12/29/2015   Arthritis    Benign neoplasm of colon 12/29/2015   Bilateral foot-drop 10/18/2015   Cancer ([place])    Cardiac dysrhythmia 10/24/2012   DDD (degenerative disc disease), lumbosacral 10/24/2012   Functional dyspepsia 12/29/2015   Generalized anxiety disorder 12/29/2015   GERD (gastroesophageal reflux disease)    High cholesterol    Hypercholesterolemia 12/29/2015   Hypertension    Iron deficiency anemia 10/18/2015   Malignant lymphoma ([place]) 12/29/2015   Neuropathy, peripheral, idiopathic 11/28/2011   Osteoarthritis, generalized 12/29/2015   Polyneuropathy 12/29/2015   S/P colectomy 10/18/2015   Scoliosis 12/29/2015   Thoracic or lumbosacral neuritis or radiculitis 10/24/2012   Thyroid disease     Patient Active Problem List   Diagnosis Date Noted   Compression fracture of L1 lumbar vertebra ([place]) 12/30/2016   Multiple myeloma ([place]) 12/30/2016   GERD (gastroesophageal reflux disease) 12/30/2016   Osteoporosis with  fracture 12/29/2015   Anemia in neoplastic disease 12/29/2015   Anosmia 12/29/2015   Benign neoplasm of colon 12/29/2015   Functional dyspepsia 12/29/2015   Generalized anxiety disorder 12/29/2015   Hypercholesterolemia 12/29/2015   Malignant lymphoma ([place]) 12/29/2015   Osteoarthritis, generalized 12/29/2015   Polyneuropathy 12/29/2015   Scoliosis 12/29/2015   Acquired hypothyroidism 10/18/2015   Bilateral foot-drop 10/18/2015   Iron deficiency anemia 10/18/2015   S/P colectomy 10/18/2015   Cardiac dysrhythmia 10/24/2012   DDD (degenerative disc disease), lumbosacral 10/24/2012   Essential hypertension 10/24/2012   Thoracic or lumbosacral neuritis or radiculitis 10/24/2012   Neuropathy, peripheral, idiopathic 11/28/2011    Past Surgical History:  Procedure Laterality Date   ABDOMINAL HYSTERECTOMY     CATARACT EXTRACTION W/ INTRAOCULAR LENS IMPLANT  2000   [place]     OB History   No obstetric history on file.     Family History  Problem Relation Age of Onset   Cancer Mother    Cancer Father    Heart disease Brother    Stroke Brother     Social History   Tobacco Use   Smoking status: Never Smoker   Smokeless tobacco: Never Used  Substance Use Topics   Alcohol use: No   Drug use: No    Home Medications Prior to Admission medications   Medication Sig Start Date End Date Taking? Authorizing Provider  amoxicillin-clavulanate (  AUGMENTIN) 875-125 MG tablet Take 1 tablet by mouth every 12 (twelve) hours for 5 days. 08/26/19 08/31/19  Eustaquio Maize, PA-C  Ascorbic Acid (VITAMIN C WITH ROSE HIPS) 1000 MG tablet Take 500 mg by mouth daily.    [provider]  aspirin EC 81 MG tablet Take 81 mg by mouth daily.    [provider]  bacitracin ointment Apply 1 application 2 (two) times daily topically. 03/25/17   Varney Biles, MD    Calcium Carb-Cholecalciferol (CALCIUM-VITAMIN D) 500-200 MG-UNIT tablet Take 1 tablet by mouth. Take one tablet daily with lunch    [provider]  cyanocobalamin (TH VITAMIN B12) 100 MCG tablet Take by mouth. Take one tablet daily for Vitamin B-12    [provider]  doxycycline (VIBRAMYCIN) 100 MG capsule Take 1 capsule (100 mg total) by mouth 2 (two) times daily for 5 days. 08/26/19 08/31/19  Eustaquio Maize, PA-C  ferrous sulfate 325 (65 FE) MG tablet Take 325 mg by mouth. Take one tablet on Mon, Wed, Fri, Sat.    [provider]  gabapentin (NEURONTIN) 600 MG tablet Take 600 mg by mouth. Take one tablet four times daily    [provider]  HYDROcodone-acetaminophen (NORCO/VICODIN) 5-325 MG tablet Take 1 tablet by mouth. Take one tablet every 6 hours as needed for up to 5 days . Take one tablet every 8 hours for pain routinely 12/27/16   [provider]  ibuprofen (ADVIL,MOTRIN) 400 MG tablet Take 400 mg by mouth. Take one tablet every 8 hours as needed for pain    [provider]  levothyroxine (SYNTHROID, LEVOTHROID) 50 MCG tablet Take 50 mcg by mouth daily before breakfast.    [provider]  methocarbamol (ROBAXIN) 500 MG tablet Take 500 mg by mouth. Take one tablet twice daily for muscle spasms    [provider]  metoprolol succinate (TOPROL-XL) 25 MG 24 hr tablet Take 12.5 mg by mouth. Take  once daily    [provider]  Multiple Vitamin (MULTI-VITAMINS) TABS Take by mouth. Take one tablet daily    [provider]  omeprazole (PRILOSEC) 20 MG capsule Take 20 mg by mouth daily.    [provider]  potassium chloride SA (K-DUR,KLOR-CON) 20 MEQ tablet Take 20 mEq by mouth. Take one tablet once daily    [provider]  saccharomyces boulardii (FLORASTOR) 250 MG capsule Take 250 mg by mouth. Take one capsule every evening    [provider]  simvastatin (ZOCOR) 10 MG tablet Take  10 mg by mouth. Take one tablet nightly for cholesterol 12/02/16   [provider]    Allergies    Duloxetine and Pregabalin  Review of Systems   Review of Systems  Constitutional: Positive for fever.  Respiratory: Positive for cough.   Cardiovascular: Negative for chest pain.  All other systems reviewed and are negative.   Physical Exam Updated Vital Signs BP (!) 154/78 (BP Location: Left Arm)    Pulse 76    Temp 99.8 F (37.7 C) (Oral)    Resp 20    Ht '4\' 10"'  (1.473 m)    Wt 68 kg    SpO2 96%    BMI 31.35 kg/m   Physical Exam Vitals and nursing note reviewed.  Constitutional:      Appearance: He is not ill-appearing or diaphoretic.  HENT:     Head: Normocephalic and atraumatic.     Right Ear: Tympanic membrane normal.  Left Ear: Tympanic membrane normal.  Eyes:     Conjunctiva/sclera: Conjunctivae normal.  Cardiovascular:     Rate and Rhythm: Normal rate and regular rhythm.     Pulses: Normal pulses.  Pulmonary:     Effort: Pulmonary effort is normal.     Breath sounds: Rales present. No wheezing or rhonchi.  Chest:     Chest wall: No tenderness.  Abdominal:     Palpations: Abdomen is soft.     Tenderness: There is no abdominal tenderness. There is no guarding or rebound.  Musculoskeletal:     Cervical back: Neck supple.  Skin:    General: Skin is warm and dry.  Neurological:     Mental Status: He is alert.     ED Results / Procedures / Treatments   Labs (all labs ordered are listed, but only abnormal results are displayed) Labs Reviewed  COMPREHENSIVE METABOLIC PANEL - Abnormal; Notable for the following components:      Result Value   Glucose, Bld 112 (*)    All other components within normal limits  CBC WITH DIFFERENTIAL/PLATELET - Abnormal; Notable for the following components:   WBC 12.1 (*)    Platelets 102 (*)    All other components within normal limits    EKG None  Radiology DG Chest Port 1 View  Result Date:  08/26/2019 CLINICAL DATA:  Cough and shortness of breath. EXAM: PORTABLE CHEST 1 VIEW COMPARISON:  Radiograph 08/15/2016 FINDINGS: Cardiomegaly, progressed from prior. Aortic atherosclerosis. Mitral annulus calcifications. Left basilar opacity not well assessed on this AP view with rotation. Mild chronic bronchial thickening. No pulmonary edema. No pneumothorax. Scoliotic curvature of the spine. IMPRESSION: 1. Left basilar opacity, not well assessed on this AP view with rotation. This may represent a prominent epicardial fat pad versus retrocardiac airspace disease/atelectasis. 2. Cardiomegaly, increased from prior. Aortic Atherosclerosis (ICD10-I70.0). Electronically Signed   By: Keith Rake M.D.   On: 08/26/2019 21:56    Procedures Procedures (including critical care time)  Medications Ordered in ED Medications  doxycycline (VIBRA-TABS) tablet 100 mg (has no administration in time range)  amoxicillin-clavulanate (AUGMENTIN) 875-125 MG per tablet 1 tablet (has no administration in time range)    ED Course  I have reviewed the triage vital signs and the nursing notes.  Pertinent labs & imaging results that were available during my care of the patient were reviewed by me and considered in my medical decision making (see chart for details).    MDM Rules/Calculators/A&P                      86 year old female who presents to the ED with daughter with complaint of nasal congestion, cough, fever for the past couple of days.  On arrival to the ED patient's temp 99 Fahrenheit, most recently took Tylenol earlier today after temperature 100.4.  She is nontachycardic and nontachypneic.  She is satting 96% on room air.  She denies any worsening shortness of breath compared to baseline.  She has been vaccinated for COVID-19 in February.  States she feels fine and she is ready to go home.  Will obtain a chest x-ray and reevaluate.   X-ray does show questionable opacity in the left base.  Given this  will obtain screening labs at this time.   CBC with mild leukocytosis of 12,000. No other acute findings on lab work. Will treat as CAP. Will give first dose of medication in the ED tonight. Given age and comorbidities will  treat with augmentin and doxy. Pt advised to follow up with PCP in 1-2 days for recheck. Strict return precautions discussed. Daughter and pt feel good with pt going home for now. Discussed case with attending physician Dr. Stark Jock who agrees with plan. Pt discharged at this time in stable condition.   This note was prepared using Dragon voice recognition software and may include unintentional dictation errors due to the inherent limitations of voice recognition software.  Final Clinical Impression(s) / ED Diagnoses Final diagnoses:  Cough  Community acquired pneumonia of left lung, unspecified part of lung    Rx / DC Orders ED Discharge Orders         Ordered    amoxicillin-clavulanate (AUGMENTIN) 875-125 MG tablet  Every 12 hours     08/26/19 2323    doxycycline (VIBRAMYCIN) 100 MG capsule  2 times daily     08/26/19 2323           Discharge Instructions     Please pick up antibiotics and take as prescribed Follow up with PCP in 1-2 days for recheck Return to the ED IMMEDIATELY for any worsening symptoms including worsening cough, persistent fevers after 48 hours on antibiotics, worsening shortness of breath, or any other associated symptoms        Eustaquio Maize, PA-C 08/26/19 2327    Veryl Speak, MD 08/26/19 2345

## 2019-08-26 NOTE — ED Triage Notes (Addendum)
Pt c/o cough/flu sx started yesterday-diarrhea x today-to triage on own rollator

## 2019-11-27 ENCOUNTER — Encounter (HOSPITAL_BASED_OUTPATIENT_CLINIC_OR_DEPARTMENT_OTHER): Payer: Self-pay | Admitting: Emergency Medicine

## 2019-11-27 ENCOUNTER — Other Ambulatory Visit: Payer: Self-pay

## 2019-11-27 ENCOUNTER — Emergency Department (HOSPITAL_BASED_OUTPATIENT_CLINIC_OR_DEPARTMENT_OTHER): Payer: Medicare Other

## 2019-11-27 ENCOUNTER — Emergency Department (HOSPITAL_BASED_OUTPATIENT_CLINIC_OR_DEPARTMENT_OTHER)
Admission: EM | Admit: 2019-11-27 | Discharge: 2019-11-27 | Disposition: A | Discharge status: Home / Self Care | Payer: Medicare Other | Attending: Emergency Medicine | Admitting: Emergency Medicine

## 2019-11-27 DIAGNOSIS — S8991XA Unspecified injury of right lower leg, initial encounter: Secondary | ICD-10-CM | POA: Diagnosis present

## 2019-11-27 DIAGNOSIS — R600 Localized edema: Secondary | ICD-10-CM | POA: Insufficient documentation

## 2019-11-27 DIAGNOSIS — Z79899 Other long term (current) drug therapy: Secondary | ICD-10-CM | POA: Diagnosis not present

## 2019-11-27 DIAGNOSIS — Y929 Unspecified place or not applicable: Secondary | ICD-10-CM | POA: Insufficient documentation

## 2019-11-27 DIAGNOSIS — S81809A Unspecified open wound, unspecified lower leg, initial encounter: Secondary | ICD-10-CM

## 2019-11-27 DIAGNOSIS — Y999 Unspecified external cause status: Secondary | ICD-10-CM | POA: Diagnosis not present

## 2019-11-27 DIAGNOSIS — S81802A Unspecified open wound, left lower leg, initial encounter: Secondary | ICD-10-CM | POA: Insufficient documentation

## 2019-11-27 DIAGNOSIS — X58XXXA Exposure to other specified factors, initial encounter: Secondary | ICD-10-CM | POA: Insufficient documentation

## 2019-11-27 DIAGNOSIS — S81801A Unspecified open wound, right lower leg, initial encounter: Secondary | ICD-10-CM | POA: Diagnosis not present

## 2019-11-27 DIAGNOSIS — Y939 Activity, unspecified: Secondary | ICD-10-CM | POA: Diagnosis not present

## 2019-11-27 DIAGNOSIS — E039 Hypothyroidism, unspecified: Secondary | ICD-10-CM | POA: Diagnosis not present

## 2019-11-27 DIAGNOSIS — Z79890 Hormone replacement therapy (postmenopausal): Secondary | ICD-10-CM | POA: Insufficient documentation

## 2019-11-27 DIAGNOSIS — I10 Essential (primary) hypertension: Secondary | ICD-10-CM | POA: Insufficient documentation

## 2019-11-27 LAB — BASIC METABOLIC PANEL
Anion gap: 10 (ref 5–15)
BUN: 16 mg/dL (ref 8–23)
CO2: 21 mmol/L — ABNORMAL LOW (ref 22–32)
Calcium: 8.4 mg/dL — ABNORMAL LOW (ref 8.9–10.3)
Chloride: 109 mmol/L (ref 98–111)
Creatinine, Ser: 1.06 mg/dL — ABNORMAL HIGH (ref 0.44–1.00)
GFR calc Af Amer: 54 mL/min — ABNORMAL LOW (ref >60)
GFR calc non Af Amer: 46 mL/min — ABNORMAL LOW (ref >60)
Glucose, Bld: 101 mg/dL — ABNORMAL HIGH (ref 70–99)
Potassium: 3.6 mmol/L (ref 3.5–5.1)
Sodium: 140 mmol/L (ref 135–145)

## 2019-11-27 LAB — CBC WITH DIFFERENTIAL/PLATELET
Abs Immature Granulocytes: 0.11 10*3/uL — ABNORMAL HIGH (ref 0.00–0.07)
Basophils Absolute: 0.1 10*3/uL (ref 0.0–0.1)
Basophils Relative: 1 %
Eosinophils Absolute: 0.4 10*3/uL (ref 0.0–0.5)
Eosinophils Relative: 2 %
HCT: 38.5 % (ref 36.0–46.0)
Hemoglobin: 12 g/dL (ref 12.0–15.0)
Immature Granulocytes: 1 %
Lymphocytes Relative: 22 %
Lymphs Abs: 3.7 10*3/uL (ref 0.7–4.0)
MCH: 30.8 pg (ref 26.0–34.0)
MCHC: 31.2 g/dL (ref 30.0–36.0)
MCV: 98.7 fL (ref 80.0–100.0)
Monocytes Absolute: 1.2 10*3/uL — ABNORMAL HIGH (ref 0.1–1.0)
Monocytes Relative: 7 %
Neutro Abs: 11.1 10*3/uL — ABNORMAL HIGH (ref 1.7–7.7)
Neutrophils Relative %: 67 %
Platelets: 72 10*3/uL — ABNORMAL LOW (ref 150–400)
RBC: 3.9 MIL/uL (ref 3.87–5.11)
RDW: 14 % (ref 11.5–15.5)
Smear Review: NORMAL
WBC: 16.5 10*3/uL — ABNORMAL HIGH (ref 4.0–10.5)
nRBC: 0 % (ref 0.0–0.2)

## 2019-11-27 LAB — BRAIN NATRIURETIC PEPTIDE: B Natriuretic Peptide: 428.9 pg/mL — ABNORMAL HIGH (ref 0.0–100.0)

## 2019-11-27 MED ORDER — FUROSEMIDE 20 MG PO TABS: 20.0000 mg | ORAL_TABLET | Freq: Every day | ORAL | 0 refills | Status: AC

## 2019-11-27 MED ORDER — CEPHALEXIN 500 MG PO CAPS: 500.0000 mg | ORAL_CAPSULE | Freq: Two times a day (BID) | ORAL | 0 refills | Status: AC

## 2019-11-27 NOTE — ED Provider Notes (Signed)
[facility] EMERGENCY DEPARTMENT Provider Note   CSN: [id number] Arrival date & time: 11/27/19  1241     History Chief Complaint  Patient presents with  . Leg Swelling    Judith Clarke is a 86 y.o. adult with history of multiple myeloma, degenerative disc disease, bilateral foot drop, hypothyroidism, osteoporosis, anemia, hyperlipidemia, hypertension, polyneuropathy presents accompanied by daughter for evaluation of acute onset, persistent and worsening wounds to the bilateral lower extremities for 2 weeks and bilateral lower extremity edema for 1 week.  Daughter has been cleaning the wounds with hydrogen peroxide and applying topical antibiotic ointment with no relief.  Patient reports some mild soreness around the wounds.  She is unsure how she sustained the wounds but thinks that perhaps her dogs may have jumped on her.  When the swelling began daughter called the PCP who recommended stopping the Decadron which she takes chronically for her multiple myeloma.  Patient denies fevers, chest pain, or significant shortness of breath.  Daughter has noted the patient to be generally weak but has had good appetite.  No nausea or vomiting.    The history is provided by the patient.       Past Medical History:  Diagnosis Date  . Acquired hypothyroidism 10/18/2015  . Age related osteoporosis 12/29/2015  . Anemia in neoplastic disease 12/29/2015  . Anosmia 12/29/2015  . Arthritis   . Benign neoplasm of colon 12/29/2015  . Bilateral foot-drop 10/18/2015  . Cancer ([place])   . Cardiac dysrhythmia 10/24/2012  . DDD (degenerative disc disease), lumbosacral 10/24/2012  . Functional dyspepsia 12/29/2015  . Generalized anxiety disorder 12/29/2015  . GERD (gastroesophageal reflux disease)   . High cholesterol   . Hypercholesterolemia 12/29/2015  . Hypertension   . Iron deficiency anemia 10/18/2015  . Malignant lymphoma ([place]) 12/29/2015  . Neuropathy, peripheral, idiopathic 11/28/2011  .  Osteoarthritis, generalized 12/29/2015  . Polyneuropathy 12/29/2015  . S/P colectomy 10/18/2015  . Scoliosis 12/29/2015  . Thoracic or lumbosacral neuritis or radiculitis 10/24/2012  . Thyroid disease       Past Surgical History:  Procedure Laterality Date  . ABDOMINAL HYSTERECTOMY    . CATARACT EXTRACTION W/ INTRAOCULAR LENS IMPLANT  2000  . [place]  . [place]  . MASTOIDECTOMY  1933  . PARTIAL COLECTOMY  1993     OB History   No obstetric history on file.     Family History  Problem Relation Age of Onset  . Cancer Mother   . Cancer Father   . Heart disease Brother   . Stroke Brother     Social History   Tobacco Use  . Smoking status: Never Smoker  . Smokeless tobacco: Never Used  Vaping Use  . Vaping Use: Never used  Substance Use Topics  . Alcohol use: No  . Drug use: No    Home Medications Prior to Admission medications   Medication Sig Start Date End Date Taking? Authorizing Provider  Ascorbic Acid (VITAMIN C WITH ROSE HIPS) 1000 MG tablet Take 500 mg by mouth daily.    [provider]  aspirin EC 81 MG tablet Take 81 mg by mouth daily.    [provider]  bacitracin ointment Apply 1 application 2 (two) times daily topically. 03/25/17   Varney Biles, MD  Calcium Carb-Cholecalciferol (CALCIUM-VITAMIN D) 500-200 MG-UNIT tablet Take 1 tablet by mouth. Take one tablet daily with lunch    [provider]  cephALEXin (KEFLEX) 500 MG capsule Take  1 capsule (500 mg total) by mouth 2 (two) times daily for 7 days. 11/27/19 12/04/19  Balian Schaller A, PA-C  cyanocobalamin (TH VITAMIN B12) 100 MCG tablet Take by mouth. Take one tablet daily for Vitamin B-12    [provider]  ferrous sulfate 325 (65 FE) MG tablet Take 325 mg by mouth. Take one tablet on Mon, Wed, Fri, Sat.    [provider]  furosemide (LASIX) 20 MG tablet Take 1 tablet (20 mg total) by mouth daily for 3 days. 11/27/19 11/30/19  Rodell Perna A, PA-C  gabapentin (NEURONTIN) 600 MG tablet Take 600 mg by mouth. Take one tablet four times daily    [provider]  HYDROcodone-acetaminophen (NORCO/VICODIN) 5-325 MG tablet Take 1 tablet by mouth. Take one tablet every 6 hours as needed for up to 5 days . Take one tablet every 8 hours for pain routinely 12/27/16   [provider]  ibuprofen (ADVIL,MOTRIN) 400 MG tablet Take 400 mg by mouth. Take one tablet every 8 hours as needed for pain    [provider]  levothyroxine (SYNTHROID, LEVOTHROID) 50 MCG tablet Take 50 mcg by mouth daily before breakfast.    [provider]  methocarbamol (ROBAXIN) 500 MG tablet Take 500 mg by mouth. Take one tablet twice daily for muscle spasms    [provider]  metoprolol succinate (TOPROL-XL) 25 MG 24 hr tablet Take 12.5 mg by mouth. Take  once daily    [provider]  Multiple Vitamin (MULTI-VITAMINS) TABS Take by mouth. Take one tablet daily    [provider]  omeprazole (PRILOSEC) 20 MG capsule Take 20 mg by mouth daily.    [provider]  potassium chloride SA (K-DUR,KLOR-CON) 20 MEQ tablet Take 20 mEq by mouth. Take one tablet once daily    [provider]  saccharomyces boulardii (FLORASTOR) 250 MG capsule Take 250 mg by mouth. Take one capsule every evening    [provider]  simvastatin (ZOCOR) 10 MG tablet Take 10 mg by mouth. Take one tablet nightly for cholesterol 12/02/16   [provider]    Allergies    Duloxetine and Pregabalin  Review of Systems   Review of Systems  Constitutional: Negative for chills and fever.  Respiratory: Negative for shortness of breath.   Cardiovascular: Positive for leg swelling. Negative for chest pain.  Gastrointestinal: Negative for abdominal pain, nausea and vomiting.  Skin: Positive for wound.  All other systems reviewed and are negative.   Physical Exam Updated Vital Signs BP (!) 146/88 (BP  Location: Right Arm)   Pulse (!) 55   Temp 98.2 F (36.8 C) (Oral)   Resp 20   Ht _0  (1.473 m)   Wt 66.2 kg   SpO2 96%   BMI 30.51 kg/m   Physical Exam Vitals and nursing note reviewed.  Constitutional:      General: He is not in acute distress.    Appearance: He is well-developed.     Comments: Resting comfortably in bed  HENT:     Head: Normocephalic and atraumatic.  Eyes:     General:        Right eye: No discharge.        Left eye: No discharge.     Conjunctiva/sclera: Conjunctivae normal.  Neck:     Vascular: No JVD.     Trachea: No tracheal deviation.  Cardiovascular:     Rate and Rhythm: Normal rate and regular rhythm.  Comments: 2+ radial and DP/PT pulses bilaterally.  Bevelyn Buckles' sign absent bilaterally.  Compartments are soft. Pulmonary:     Effort: Pulmonary effort is normal.     Comments: Bibasilar crackles, speaking in full sentences without difficulty. Abdominal:     General: There is no distension.     Palpations: Abdomen is soft.     Tenderness: There is no abdominal tenderness. There is no guarding or rebound.  Musculoskeletal:     Cervical back: Neck supple.     Right lower leg: Edema present.     Left lower leg: Edema present.     Comments: See below images.  Patient with multiple wounds to the bilateral lower extremities measuring less than 2.5 cm each.  No drainage.  Some mild surrounding erythema.  2+ pitting edema of the bilateral lower extremities.  Skin:    General: Skin is warm and dry.     Findings: Erythema present.  Neurological:     Mental Status: He is alert.     Comments: Bilateral foot drop  Psychiatric:        Behavior: Behavior normal.           ED Results / Procedures / Treatments   Labs (all labs ordered are listed, but only abnormal results are displayed) Labs Reviewed  CBC WITH DIFFERENTIAL/PLATELET - Abnormal; Notable for the following components:      Result Value   WBC 16.5 (*)    Platelets 72 (*)     Neutro Abs 11.1 (*)    Monocytes Absolute 1.2 (*)    Abs Immature Granulocytes 0.11 (*)    All other components within normal limits  BRAIN NATRIURETIC PEPTIDE - Abnormal; Notable for the following components:   B Natriuretic Peptide 428.9 (*)    All other components within normal limits  BASIC METABOLIC PANEL - Abnormal; Notable for the following components:   CO2 21 (*)    Glucose, Bld 101 (*)    Creatinine, Ser 1.06 (*)    Calcium 8.4 (*)    GFR calc non Af Amer 46 (*)    GFR calc Af Amer 54 (*)    All other components within normal limits    EKG None  Radiology DG Chest 2 View  Result Date: 11/27/2019 CLINICAL DATA:  Bilateral LOWER extremity edema for 2 days. EXAM: CHEST - 2 VIEW COMPARISON:  09/29/2019 and prior chest radiographs FINDINGS: UPPER limits normal heart size again noted. There is no evidence of focal airspace disease, pulmonary edema, suspicious pulmonary nodule/mass, pleural effusion, or pneumothorax. No acute bony abnormalities are identified. UPPER lumbar compression fracture and vertebral augmentation changes again noted. IMPRESSION: No active cardiopulmonary disease. Electronically Signed   By: Margarette Canada M.D.   On: 11/27/2019 15:17    Procedures Procedures (including critical care time)  Medications Ordered in ED Medications - No data to display  ED Course  I have reviewed the triage vital signs and the nursing notes.  Pertinent labs & imaging results that were available during my care of the patient were reviewed by me and considered in my medical decision making (see chart for details).    MDM Rules/Calculators/A&P                          Patient presenting for evaluation of bilateral lower extremity edema and wounds.  She is afebrile, vital signs are stable.  She is nontoxic in appearance.  She denies chest pain or shortness of  breath, mild bibasilar crackles noted on auscultation of the lungs.  She has pitting edema of the bilateral lower  extremities as well as multiple healing wounds with thick eschar and surrounding erythema.  No streaking.  Compartments are soft.  Bevelyn Buckles' sign is absent bilaterally and I have a low suspicion of DVT in this patient.   Chest x-ray shows no active cardiopulmonary disease, no evidence of edema or consolidation or cardiomegaly (though heart is upper limit of normal size).  Lab work reviewed and interpreted by myself shows nonspecific leukocytosis.  She is chronically on steroids so this could be in the setting of steroid use although she did discontinue her steroid about 1 week ago when her swelling began.  No anemia, no metabolic derangements.  Her creatinine is mildly elevated but BUN is within normal limits.  She denies chest pain and I doubt ACS/MI.  Her BNP is elevated.   We will start her on low-dose Lasix to be taken for the next 3 days and also encouraged elevation and compression stockings.  We will start her on a course of Keflex for the mild cellulitis.  She will call her PCP on Monday to schedule close follow-up appointment.  Discussed strict ED return precautions.  Patient and daughter verbalized understanding of and agreement with plan and patient is stable for discharge at this time.  Patient was seen and evaluated by Dr. Wilson Singer who agrees with assessment and plan at this time.   Final Clinical Impression(s) / ED Diagnoses Final diagnoses:  Bilateral lower extremity edema  Multiple open wounds of lower extremity, initial encounter    Rx / DC Orders ED Discharge Orders         Ordered    furosemide (LASIX) 20 MG tablet  Daily     Discontinue  Reprint     11/27/19 1608    cephALEXin (KEFLEX) 500 MG capsule  2 times daily     Discontinue  Reprint     11/27/19 [place], Miniya Miguez A, PA-C 11/27/19 1614    Virgel Manifold, MD 11/28/19 [phone]

## 2019-11-27 NOTE — Discharge Instructions (Signed)
Please take all of your antibiotics until finished!   Take your antibiotics with food.  Common side effects of antibiotics include nausea, vomiting, abdominal discomfort, and diarrhea. You may help offset some of this with probiotics which you can buy or get in yogurt. Do not eat  or take the probiotics until 2 hours after your antibiotic.    Start taking furosemide, 1 tablet daily for the next 3 days.  Make sure to take your potassium supplement with this.  When you are not walking, keep the lower extremities elevated.  You can also wear compression stockings to help with swelling.  Please call the primary care provider first thing Monday morning to schedule close follow-up for sometime early this week.  Return to the emergency department if any concerning signs or symptoms develop such as fevers, shortness of breath, chest pain, worsening swelling, redness, streaking of redness up the leg, or loss of consciousness.

## 2019-11-27 NOTE — ED Triage Notes (Signed)
Per daughter, pt has swelling to both feet and legs x 2 days and also has some wounds on her legs that she would like looked at.

## 2020-12-18 DEATH — deceased
